# Patient Record
Sex: Female | Born: 1995
Health system: Southern US, Community
[De-identification: ages and names within clinical notes are randomized; demographics above are authoritative.]

## PROBLEM LIST (undated history)

## (undated) ENCOUNTER — Inpatient Hospital Stay: Payer: Self-pay

## (undated) DIAGNOSIS — Z789 Other specified health status: Secondary | ICD-10-CM

## (undated) HISTORY — PX: WISDOM TOOTH EXTRACTION: SHX21

---
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
## 2015-08-31 ENCOUNTER — Ambulatory Visit (INDEPENDENT_AMBULATORY_CARE_PROVIDER_SITE_OTHER): Payer: 59 | Admitting: Primary Care

## 2015-08-31 ENCOUNTER — Encounter: Payer: Self-pay | Admitting: Primary Care

## 2015-08-31 VITALS — BP 116/82 | HR 77 | Temp 98.0°F | Ht 65.75 in | Wt 120.8 lb

## 2015-08-31 DIAGNOSIS — J309 Allergic rhinitis, unspecified: Secondary | ICD-10-CM

## 2015-08-31 NOTE — Progress Notes (Signed)
Pre visit review using our clinic review tool, if applicable. No additional management support is needed unless otherwise documented below in the visit note. 

## 2015-08-31 NOTE — Progress Notes (Signed)
   Subjective:    Patient ID: Nicole Mcdaniel, female    DOB: 07/25/1996, 19 y.o.   MRN: [id number]  HPI  [name] is a 19 year old female who presents today with a chief complaint of fever. She also reports symptoms of cough, sneezing, sore throat, sinus pressure, nasal congestion. Her fever elevated to 100.0 last night. Her symptoms first began on Sunday with sinus pressure. She's taken benadryl, " 1allergy pill", and ibuprofen. Nothing helps to improve her symptoms. She's not had any sick contacts, although works in [employer].   Review of Systems  HENT: Positive for congestion, postnasal drip, sinus pressure, sneezing and sore throat. Negative for ear pain.   Respiratory: Positive for cough. Negative for shortness of breath.   Cardiovascular: Negative for chest pain.  Musculoskeletal: Negative for myalgias.       No past medical history on file.  Social History   Social History  . Marital Status: Single    Spouse Name: N/A  . Number of Children: N/A  . Years of Education: N/A   Occupational History  . Not on file.   Social History Main Topics  . Smoking status: Current Every Day Smoker -- 0.25 packs/day    Types: Cigarettes  . Smokeless tobacco: Never Used  . Alcohol Use: No  . Drug Use: No  . Sexual Activity: Not on file   Other Topics Concern  . Not on file   Social History Narrative    No past surgical history on file.  No family history on file.  No Known Allergies  No current outpatient prescriptions on file prior to visit.   No current facility-administered medications on file prior to visit.    BP 116/82 mmHg  Pulse 77  Temp(Src) 98 F (36.7 C) (Oral)  Ht 5' 5.75" (1.67 m)  Wt 120 lb 12.8 oz (54.795 kg)  BMI 19.65 kg/m2  SpO2 99%  LMP 08/05/2015    Objective:   Physical Exam  Constitutional: She appears well-nourished.  HENT:  Right Ear: Tympanic membrane and ear canal normal.  Left Ear: Tympanic membrane and ear canal normal.  Nose:  Mucosal edema present. Right sinus exhibits maxillary sinus tenderness. Right sinus exhibits no frontal sinus tenderness. Left sinus exhibits maxillary sinus tenderness. Left sinus exhibits no frontal sinus tenderness.  Mouth/Throat: Oropharynx is clear and moist.  Eyes: Conjunctivae are normal. Pupils are equal, round, and reactive to light.  Neck: Neck supple.  Cardiovascular: Normal rate and regular rhythm.   Pulmonary/Chest: Effort normal and breath sounds normal.  Lymphadenopathy:    She has no cervical adenopathy.  Skin: Skin is warm and dry.          Assessment & Plan:  Allergic Rhinitis:  Sneezing, postnasal drip, cough, sinus pressure, low grade fever (one day), all starting 2 days ago. No relief with 1 dose of "allergy pill", ibuprofen. Exam supports diagnosis of allergic rhinitis. Treat with supportive measures. Flonase, Claritin, Robitussin, fluids, rest.  Work note provided.  She is to call Monday next week if no improvement.

## 2015-08-31 NOTE — Patient Instructions (Signed)
Your symptoms are related to allergies, follow these instructions below:  Nasal congestion: Fluticasone (Flonase) nasal spray. Instill 2 sprays in each nostril once daily.  Sneezing, throat drainage, sinus pressure: Allergy pill such as Claritin, Zyrtec, Allegra every day for 2-3 weeks.  Cough: Delsym or Robitussin.   All of these remedies can be purchased over the counter.  Increase consumption of water and rest today.  Give us a call if no improvement on Monday next week.  It was a pleasure meeting you!  Upper Respiratory Infection, Adult Most upper respiratory infections (URIs) are a viral infection of the air passages leading to the lungs. A URI affects the nose, throat, and upper air passages. The most common type of URI is nasopharyngitis and is typically referred to as "the common cold." URIs run their course and usually go away on their own. Most of the time, a URI does not require medical attention, but sometimes a bacterial infection in the upper airways can follow a viral infection. This is called a secondary infection. Sinus and middle ear infections are common types of secondary upper respiratory infections. Bacterial pneumonia can also complicate a URI. A URI can worsen asthma and chronic obstructive pulmonary disease (COPD). Sometimes, these complications can require emergency medical care and may be life threatening.  CAUSES Almost all URIs are caused by viruses. A virus is a type of germ and can spread from one person to another.  RISKS FACTORS You may be at risk for a URI if:   You smoke.   You have chronic heart or lung disease.  You have a weakened defense (immune) system.   You are very young or very old.   You have nasal allergies or asthma.  You work in crowded or poorly ventilated areas.  You work in health care facilities or schools. SIGNS AND SYMPTOMS  Symptoms typically develop 2-3 days after you come in contact with a cold virus. Most viral URIs  last 7-10 days. However, viral URIs from the influenza virus (flu virus) can last 14-18 days and are typically more severe. Symptoms may include:   Runny or stuffy (congested) nose.   Sneezing.   Cough.   Sore throat.   Headache.   Fatigue.   Fever.   Loss of appetite.   Pain in your forehead, behind your eyes, and over your cheekbones (sinus pain).  Muscle aches.  DIAGNOSIS  Your health care provider may diagnose a URI by:  Physical exam.  Tests to check that your symptoms are not due to another condition such as:  Strep throat.  Sinusitis.  Pneumonia.  Asthma. TREATMENT  A URI goes away on its own with time. It cannot be cured with medicines, but medicines may be prescribed or recommended to relieve symptoms. Medicines may help:  Reduce your fever.  Reduce your cough.  Relieve nasal congestion. HOME CARE INSTRUCTIONS   Take medicines only as directed by your health care provider.   Gargle warm saltwater or take cough drops to comfort your throat as directed by your health care provider.  Use a warm mist humidifier or inhale steam from a shower to increase air moisture. This may make it easier to breathe.  Drink enough fluid to keep your urine clear or pale yellow.   Eat soups and other clear broths and maintain good nutrition.   Rest as needed.   Return to work when your temperature has returned to normal or as your health care provider advises. You may need to  stay home longer to avoid infecting others. You can also use a face mask and careful hand washing to prevent spread of the virus.  Increase the usage of your inhaler if you have asthma.   Do not use any tobacco products, including cigarettes, chewing tobacco, or electronic cigarettes. If you need help quitting, ask your health care provider. PREVENTION  The best way to protect yourself from getting a cold is to practice good hygiene.   Avoid oral or hand contact with people with  cold symptoms.   Wash your hands often if contact occurs.  There is no clear evidence that vitamin C, vitamin E, echinacea, or exercise reduces the chance of developing a cold. However, it is always recommended to get plenty of rest, exercise, and practice good nutrition.  SEEK MEDICAL CARE IF:   You are getting worse rather than better.   Your symptoms are not controlled by medicine.   You have chills.  You have worsening shortness of breath.  You have brown or red mucus.  You have yellow or brown nasal discharge.  You have pain in your face, especially when you bend forward.  You have a fever.  You have swollen neck glands.  You have pain while swallowing.  You have white areas in the back of your throat. SEEK IMMEDIATE MEDICAL CARE IF:   You have severe or persistent:  Headache.  Ear pain.  Sinus pain.  Chest pain.  You have chronic lung disease and any of the following:  Wheezing.  Prolonged cough.  Coughing up blood.  A change in your usual mucus.  You have a stiff neck.  You have changes in your:  Vision.  Hearing.  Thinking.  Mood. MAKE SURE YOU:   Understand these instructions.  Will watch your condition.  Will get help right away if you are not doing well or get worse.   This information is not intended to replace advice given to you by your health care provider. Make sure you discuss any questions you have with your health care provider.   Document Released: 04/04/2001 Document Revised: 02/23/2015 Document Reviewed: 01/14/2014 [employer] Interactive Patient Education [employer]

## 2015-09-24 ENCOUNTER — Ambulatory Visit (INDEPENDENT_AMBULATORY_CARE_PROVIDER_SITE_OTHER): Payer: 59

## 2015-09-24 DIAGNOSIS — Z23 Encounter for immunization: Secondary | ICD-10-CM | POA: Diagnosis not present

## 2016-02-22 ENCOUNTER — Ambulatory Visit (INDEPENDENT_AMBULATORY_CARE_PROVIDER_SITE_OTHER): Payer: 59 | Admitting: Internal Medicine

## 2016-02-22 ENCOUNTER — Encounter: Payer: Self-pay | Admitting: Internal Medicine

## 2016-02-22 VITALS — BP 116/70 | HR 65 | Temp 98.0°F | Wt 121.0 lb

## 2016-02-22 DIAGNOSIS — F411 Generalized anxiety disorder: Secondary | ICD-10-CM

## 2016-02-22 MED ORDER — ESCITALOPRAM OXALATE 10 MG PO TABS: ORAL_TABLET | ORAL | Status: DC

## 2016-02-22 NOTE — Progress Notes (Signed)
Subjective:    Patient ID: Nicole Mcdaniel, female    DOB: Oct 13, 1996, 20 y.o.   MRN: [id number]  HPI  Pt presents to the clinic today to discuss anxiety. This started 1 month ago. It is intermittent but occurs more days out of the week then not. When it occurs, she feels like her heart is racing, she gets clammy, she notices tingling in her hands and feet, has racing thoughts and excessive worry. She thinks this is triggered by recent family stress- her parents have recently separated. She has had similar issues with anxiety in the past, after her best fried committed suicide. She went to the therapy after that but did not really think it helped. She denies depression, SI/HI. She has no family history of mental disorders.   Review of Systems      History reviewed. No pertinent past medical history.  No current outpatient prescriptions on file.   No current facility-administered medications for this visit.    No Known Allergies  History reviewed. No pertinent family history.  Social History   Social History  . Marital Status: Single    Spouse Name: N/A  . Number of Children: N/A  . Years of Education: N/A   Occupational History  . Not on file.   Social History Main Topics  . Smoking status: Current Every Day Smoker -- 0.75 packs/day    Types: Cigarettes  . Smokeless tobacco: Never Used  . Alcohol Use: No  . Drug Use: No  . Sexual Activity: Not on file   Other Topics Concern  . Not on file   Social History Narrative     Constitutional: Denies fever, malaise, fatigue, headache or abrupt weight changes.  Respiratory: Denies difficulty breathing, shortness of breath, cough or sputum production.   Cardiovascular: Denies chest pain, chest tightness, palpitations or swelling in the hands or feet.   Neurological: Denies dizziness, difficulty with memory, difficulty with speech or problems with balance and coordination.  Psych: Pt reports anxiety. Denies depression,  SI/HI.  No other specific complaints in a complete review of systems (except as listed in HPI above).  Objective:   Physical Exam   BP 116/70 mmHg  Pulse 65  Temp(Src) 98 F (36.7 C) (Oral)  Wt 121 lb (54.885 kg)  SpO2 99%  LMP 02/14/2016 Wt Readings from Last 3 Encounters:  02/22/16 121 lb (54.885 kg)  08/31/15 120 lb 12.8 oz (54.795 kg)  07/02/13 122 lb (55.339 kg) (45 %*, Z = -0.12)   * Growth percentiles are based on CDC 2-20 Years data.    General: Appears her stated age, well developed, well nourished in NAD. Cardiovascular: Normal rate and rhythm. S1,S2 noted.  No murmur, rubs or gallops noted.  Pulmonary/Chest: Normal effort and positive vesicular breath sounds. No respiratory distress. No wheezes, rales or ronchi noted.  Neurological: Alert and oriented.  Psychiatric: She does appear mildly anxious. Behavior is normal. Judgment and thought content normal.   CBC    Component Value Date/Time   WBC 4.5 06/30/2009 0837   RBC 4.87 06/30/2009 0837   HGB 14.2 06/30/2009 0837   HCT 42.0 06/30/2009 0837   PLT 173.0 06/30/2009 0837   MCV 86.4 06/30/2009 0837   MCHC 33.8 06/30/2009 0837   RDW 12.2 06/30/2009 0837   LYMPHSABS 0.7 06/30/2009 0837   MONOABS 0.5 06/30/2009 0837   EOSABS 0.1 06/30/2009 0837   BASOSABS 0.0 06/30/2009 0837        Assessment & Plan:  Anxiety state:  Support offered today Discussed her treatment options, medication versus therapy eRx for Lexapro 10 mg daily QHS Discussed avoiding benzodiazepines due to addictive properties She declines referral for therapy at this time  Follow up with PCP in 6 weeks

## 2016-02-22 NOTE — Patient Instructions (Signed)
Generalized Anxiety Disorder Generalized anxiety disorder (GAD) is a mental disorder. It interferes with life functions, including relationships, work, and school. GAD is different from normal anxiety, which everyone experiences at some point in their lives in response to specific life events and activities. Normal anxiety actually helps us prepare for and get through these life events and activities. Normal anxiety goes away after the event or activity is over.  GAD causes anxiety that is not necessarily related to specific events or activities. It also causes excess anxiety in proportion to specific events or activities. The anxiety associated with GAD is also difficult to control. GAD can vary from mild to severe. People with severe GAD can have intense waves of anxiety with physical symptoms (panic attacks).  SYMPTOMS The anxiety and worry associated with GAD are difficult to control. This anxiety and worry are related to many life events and activities and also occur more days than not for 6 months or longer. People with GAD also have three or more of the following symptoms (one or more in children):  Restlessness.   Fatigue.  Difficulty concentrating.   Irritability.  Muscle tension.  Difficulty sleeping or unsatisfying sleep. DIAGNOSIS GAD is diagnosed through an assessment by your health care provider. Your health care provider will ask you questions aboutyour mood,physical symptoms, and events in your life. Your health care provider may ask you about your medical history and use of alcohol or drugs, including prescription medicines. Your health care provider may also do a physical exam and blood tests. Certain medical conditions and the use of certain substances can cause symptoms similar to those associated with GAD. Your health care provider may refer you to a mental health specialist for further evaluation. TREATMENT The following therapies are usually used to treat GAD:    Medication. Antidepressant medication usually is prescribed for long-term daily control. Antianxiety medicines may be added in severe cases, especially when panic attacks occur.   Talk therapy (psychotherapy). Certain types of talk therapy can be helpful in treating GAD by providing support, education, and guidance. A form of talk therapy called cognitive behavioral therapy can teach you healthy ways to think about and react to daily life events and activities.  Stress managementtechniques. These include yoga, meditation, and exercise and can be very helpful when they are practiced regularly. A mental health specialist can help determine which treatment is best for you. Some people see improvement with one therapy. However, other people require a combination of therapies.   This information is not intended to replace advice given to you by your health care provider. Make sure you discuss any questions you have with your health care provider.   Document Released: 02/03/2013 Document Revised: 10/30/2014 Document Reviewed: 02/03/2013 Elsevier Interactive Patient Education 2016 [employer] Inc.  

## 2016-02-22 NOTE — Progress Notes (Signed)
Pre visit review using our clinic review tool, if applicable. No additional management support is needed unless otherwise documented below in the visit note. 

## 2016-04-20 ENCOUNTER — Ambulatory Visit: Payer: 59 | Admitting: Internal Medicine

## 2016-04-30 ENCOUNTER — Telehealth: Payer: Self-pay | Admitting: Family Medicine

## 2016-04-30 DIAGNOSIS — Z0000 Encounter for general adult medical examination without abnormal findings: Secondary | ICD-10-CM | POA: Insufficient documentation

## 2016-04-30 NOTE — Telephone Encounter (Signed)
-----   Message from Baldomero LamyNatasha C Chavers sent at 04/26/2016  6:03 PM EDT ----- Regarding: Cpx labs Wed 7/12, need orders. Thanks! :-) Please order  future cpx labs for pt's upcoming lab appt. Thanks Rodney Boozeasha

## 2016-05-03 ENCOUNTER — Other Ambulatory Visit: Payer: 59

## 2016-05-03 ENCOUNTER — Other Ambulatory Visit (INDEPENDENT_AMBULATORY_CARE_PROVIDER_SITE_OTHER): Payer: 59

## 2016-05-03 DIAGNOSIS — Z0000 Encounter for general adult medical examination without abnormal findings: Secondary | ICD-10-CM | POA: Diagnosis not present

## 2016-05-03 LAB — COMPREHENSIVE METABOLIC PANEL
ALBUMIN: 4.5 g/dL (ref 3.5–5.2)
ALT: 24 U/L (ref 0–35)
AST: 18 U/L (ref 0–37)
Alkaline Phosphatase: 61 U/L (ref 39–117)
BUN: 18 mg/dL (ref 6–23)
CHLORIDE: 106 meq/L (ref 96–112)
CO2: 26 mEq/L (ref 19–32)
Calcium: 9.7 mg/dL (ref 8.4–10.5)
Creatinine, Ser: 0.59 mg/dL (ref 0.40–1.20)
GFR: 136.73 mL/min (ref >60.00)
Glucose, Bld: 88 mg/dL (ref 70–99)
POTASSIUM: 3.6 meq/L (ref 3.5–5.1)
SODIUM: 140 meq/L (ref 135–145)
Total Bilirubin: 0.8 mg/dL (ref 0.2–1.2)
Total Protein: 7.6 g/dL (ref 6.0–8.3)

## 2016-05-03 LAB — LIPID PANEL
CHOLESTEROL: 191 mg/dL (ref 0–200)
HDL: 68.3 mg/dL (ref >39.00)
LDL CALC: 111 mg/dL — AB (ref 0–99)
NonHDL: 122.24
Total CHOL/HDL Ratio: 3
Triglycerides: 57 mg/dL (ref 0.0–149.0)
VLDL: 11.4 mg/dL (ref 0.0–40.0)

## 2016-05-03 LAB — CBC WITH DIFFERENTIAL/PLATELET
BASOS PCT: 0.6 % (ref 0.0–3.0)
Basophils Absolute: 0 10*3/uL (ref 0.0–0.1)
EOS PCT: 2.2 % (ref 0.0–5.0)
Eosinophils Absolute: 0.1 10*3/uL (ref 0.0–0.7)
HCT: 41.8 % (ref 36.0–46.0)
HEMOGLOBIN: 13.9 g/dL (ref 12.0–15.0)
LYMPHS ABS: 1.8 10*3/uL (ref 0.7–4.0)
Lymphocytes Relative: 31.7 % (ref 12.0–46.0)
MCHC: 33.2 g/dL (ref 30.0–36.0)
MCV: 86.5 fl (ref 78.0–100.0)
MONO ABS: 0.4 10*3/uL (ref 0.1–1.0)
Monocytes Relative: 7.4 % (ref 3.0–12.0)
NEUTROS ABS: 3.4 10*3/uL (ref 1.4–7.7)
NEUTROS PCT: 58.1 % (ref 43.0–77.0)
Platelets: 215 10*3/uL (ref 150.0–400.0)
RBC: 4.83 Mil/uL (ref 3.87–5.11)
RDW: 14.5 % (ref 11.5–15.5)
WBC: 5.8 10*3/uL (ref 4.0–10.5)

## 2016-05-03 LAB — TSH: TSH: 0.73 u[IU]/mL (ref 0.35–4.50)

## 2016-05-10 ENCOUNTER — Telehealth: Payer: Self-pay

## 2016-05-10 ENCOUNTER — Encounter: Payer: 59 | Admitting: Family Medicine

## 2016-05-10 NOTE — Telephone Encounter (Signed)
I will see her on 7/21 as planned

## 2016-05-10 NOTE — Telephone Encounter (Signed)
[facility]; this [facility] note did not come to my desk top; just found on TH portal; pt has CPX on 05/12/16 with Dr Milinda Antisower.

## 2016-05-10 NOTE — Telephone Encounter (Signed)
PLEASE NOTE: All timestamps contained within this report are represented as [place] Standard Time. CONFIDENTIALTY NOTICE: This fax transmission is intended only for the addressee. It contains information that is legally privileged, confidential or otherwise protected from use or disclosure. If you are not the intended recipient, you are strictly prohibited from reviewing, disclosing, copying using or disseminating any of this information or taking any action in reliance on or regarding this information. If you have received this fax in error, please notify us immediately by telephone so that we can arrange for its return to us. Phone: [phone], Toll-Free: [phone], Fax: [phone] Page: 1 of 1 Call Id: [id number] [facility] Client Nonclinical Telephone Record [facility] Day Physician Milinda Antisower, Idamae SchullerMarne - MD Contact Type Call Who Is Calling Patient / Member / Family / Caregiver Caller Name Tana ConchHannah Skarzynski Caller Phone Number [phone] Patient Name Tana ConchHannah Kashuba Call Type Message Only Information Provided Reason for Call Request for General Office Information Initial Comment Caller states she has been dx with a UTI twice in the past month, it keeps coming back after the rounds of abx. She wants to know if she should come in for more testing. Additional Comment Call Closed By: Neta EhlersEmily Cogburn Transaction Date/Time: 05/10/2016 3:21:42 PM (ET)

## 2016-05-11 NOTE — Telephone Encounter (Signed)
Called pt to let her know we will address it at her appt tomorrow but pt didn't answer and voicemail box is full

## 2016-05-12 ENCOUNTER — Other Ambulatory Visit (HOSPITAL_COMMUNITY)
Admission: RE | Admit: 2016-05-12 | Discharge: 2016-05-12 | Disposition: A | Discharge status: Home / Self Care | Payer: 59 | Source: Ambulatory Visit | Attending: Family Medicine | Admitting: Family Medicine

## 2016-05-12 ENCOUNTER — Encounter: Payer: Self-pay | Admitting: Family Medicine

## 2016-05-12 ENCOUNTER — Ambulatory Visit (INDEPENDENT_AMBULATORY_CARE_PROVIDER_SITE_OTHER): Payer: 59 | Admitting: Family Medicine

## 2016-05-12 VITALS — BP 130/78 | HR 72 | Temp 98.2°F | Ht 65.75 in | Wt 122.0 lb

## 2016-05-12 DIAGNOSIS — Z113 Encounter for screening for infections with a predominantly sexual mode of transmission: Secondary | ICD-10-CM | POA: Diagnosis present

## 2016-05-12 DIAGNOSIS — Z01419 Encounter for gynecological examination (general) (routine) without abnormal findings: Secondary | ICD-10-CM | POA: Insufficient documentation

## 2016-05-12 DIAGNOSIS — Z0000 Encounter for general adult medical examination without abnormal findings: Secondary | ICD-10-CM | POA: Diagnosis not present

## 2016-05-12 DIAGNOSIS — Z720 Tobacco use: Secondary | ICD-10-CM

## 2016-05-12 DIAGNOSIS — N920 Excessive and frequent menstruation with regular cycle: Secondary | ICD-10-CM | POA: Diagnosis not present

## 2016-05-12 DIAGNOSIS — R300 Dysuria: Secondary | ICD-10-CM

## 2016-05-12 DIAGNOSIS — F17200 Nicotine dependence, unspecified, uncomplicated: Secondary | ICD-10-CM

## 2016-05-12 LAB — HM PAP SMEAR: HM PAP: NORMAL

## 2016-05-12 MED ORDER — LEVONORGESTREL-ETHINYL ESTRAD 0.1-20 MG-MCG PO TABS: 1.0000 | ORAL_TABLET | Freq: Every day | ORAL | Status: DC

## 2016-05-12 MED ORDER — PHENAZOPYRIDINE HCL 200 MG PO TABS: 200.0000 mg | ORAL_TABLET | Freq: Three times a day (TID) | ORAL | Status: AC | PRN

## 2016-05-12 MED ORDER — ESCITALOPRAM OXALATE 10 MG PO TABS: ORAL_TABLET | ORAL | Status: DC

## 2016-05-12 NOTE — Patient Instructions (Addendum)
Increase water intake and eliminate other beverages for at least a week  If urinary symptoms are not gone in 1-2 weeks -let me know so we can refer you to urology  Please work on quitting smoking - this can cause blood clots with birth control  Start the pill the first Sunday after your period starts (if it starts on a Sunday-start it that day)  Continue condom use for STD prevention  Quit smoking  Use your phone alarm or app so you do not miss doses  Today we did pap and std screen  Take care of yourself

## 2016-05-12 NOTE — Progress Notes (Signed)
Pre visit review using our clinic review tool, if applicable. No additional management support is needed unless otherwise documented below in the visit note. 

## 2016-05-12 NOTE — Progress Notes (Signed)
Subjective:    Patient ID: Nicole Mcdaniel, female    DOB: 06/18/1996, 20 y.o.   MRN: [id number]  HPI Here for health maintenance exam and to review chronic medical problems    Has had many utis lately  [facility] and currently on abx - did culture (still pending)  Had wbc on ua  Did pelvic exam - wet prep looked pretty normal   Looked on care everywhere- urine culture from 7/19 is negative/ no growth    Had utis in June (given cipro) - fine until the 15th  and then 7/15 also macrobid - culture was done - negative for everything  On pyridium for pain   Has never been through this before  On 7/19 - burning to urinate and pressure on bladder  Improved for the most part - but still some pressure  No dysuria but still taking pyridium   She drinks tea and soda and lemonade and water  Has inc her water    Wt Readings from Last 3 Encounters:  05/12/16 122 lb (55.339 kg)  02/22/16 121 lb (54.885 kg)  08/31/15 120 lb 12.8 oz (54.795 kg)  bmi of 19.8  BP Readings from Last 3 Encounters:  05/12/16 130/78  02/22/16 116/70  08/31/15 116/82     Smoking status - she would like to quit  Thinks she could put them down cold [place]  Has quit for 3 mo before   HIV screening -has not had  Is interested in STD screening (gc and chlamydia) No new exposures  One partner now  No vaginal symptoms  Has not had a pap test yet -needs to get her first one  Uses condoms most of the time Does not want to get pregnant    Tetanus shot 8/13  Pap /gyn - periods (are very heavy)  Got one yeast infection- took something otc  Had considered going on the pill - is really bad about taking med  She in the past has had trouble taking pill every day -now has an app to remind her  Has been on lessina in the past   Has never tried the depo shot    Flu shot 12/16  On lexapro for mood  Still working for her   Results for orders placed or performed in visit on 05/03/16  CBC  with Differential/Platelet  Result Value Ref Range   WBC 5.8 4.0 - 10.5 K/uL   RBC 4.83 3.87 - 5.11 Mil/uL   Hemoglobin 13.9 12.0 - 15.0 g/dL   HCT 36.641.8 44.036.0 - 34.746.0 %   MCV 86.5 78.0 - 100.0 fl   MCHC 33.2 30.0 - 36.0 g/dL   RDW 42.514.5 95.611.5 - 38.715.5 %   Platelets 215.0 150.0 - 400.0 K/uL   Neutrophils Relative % 58.1 43.0 - 77.0 %   Lymphocytes Relative 31.7 12.0 - 46.0 %   Monocytes Relative 7.4 3.0 - 12.0 %   Eosinophils Relative 2.2 0.0 - 5.0 %   Basophils Relative 0.6 0.0 - 3.0 %   Neutro Abs 3.4 1.4 - 7.7 K/uL   Lymphs Abs 1.8 0.7 - 4.0 K/uL   Monocytes Absolute 0.4 0.1 - 1.0 K/uL   Eosinophils Absolute 0.1 0.0 - 0.7 K/uL   Basophils Absolute 0.0 0.0 - 0.1 K/uL  Comprehensive metabolic panel  Result Value Ref Range   Sodium 140 135 - 145 mEq/L   Potassium 3.6 3.5 - 5.1 mEq/L   Chloride 106 96 - 112  mEq/L   CO2 26 19 - 32 mEq/L   Glucose, Bld 88 70 - 99 mg/dL   BUN 18 6 - 23 mg/dL   Creatinine, Ser 4.09 0.40 - 1.20 mg/dL   Total Bilirubin 0.8 0.2 - 1.2 mg/dL   Alkaline Phosphatase 61 39 - 117 U/L   AST 18 0 - 37 U/L   ALT 24 0 - 35 U/L   Total Protein 7.6 6.0 - 8.3 g/dL   Albumin 4.5 3.5 - 5.2 g/dL   Calcium 9.7 8.4 - 81.1 mg/dL   GFR 914.78 >29.56 mL/min  Lipid panel  Result Value Ref Range   Cholesterol 191 0 - 200 mg/dL   Triglycerides 21.3 0.0 - 149.0 mg/dL   HDL 08.65 >78.46 mg/dL   VLDL 96.2 0.0 - 95.2 mg/dL   LDL Cholesterol 841 (H) 0 - 99 mg/dL   Total CHOL/HDL Ratio 3    NonHDL 122.24   TSH  Result Value Ref Range   TSH 0.73 0.35 - 4.50 uIU/mL    All in nl limits  Cholesterol is pretty good with good HDL  Does not eat quite as healthy as she should    Patient Active Problem List   Diagnosis Date Noted  . Dysuria 05/12/2016  . Encounter for routine gynecological examination 05/12/2016  . Screen for STD (sexually transmitted disease) 05/12/2016  . Routine general medical examination at a health care facility 04/30/2016  . Headache(784.0) 07/31/2012    . Breast pain in female 07/05/2012  . Well adolescent visit 06/03/2012  . Heavy menses 06/03/2012  . ALLERGIC RHINITIS 01/21/2008   No past medical history on file. No past surgical history on file. Social History  Substance Use Topics  . Smoking status: Current Every Day Smoker -- 0.75 packs/day    Types: Cigarettes  . Smokeless tobacco: Never Used  . Alcohol Use: 0.0 oz/week    0 Standard drinks or equivalent per week     Comment: occ   No family history on file. No Known Allergies No current outpatient prescriptions on file prior to visit.   No current facility-administered medications on file prior to visit.    Review of Systems Review of Systems  Constitutional: Negative for fever, appetite change, fatigue and unexpected weight change.  Eyes: Negative for pain and visual disturbance.  Respiratory: Negative for cough and shortness of breath.   Cardiovascular: Negative for cp or palpitations    Gastrointestinal: Negative for nausea, diarrhea and constipation.  Genitourinary: Negative for urgency and frequency. pos for mild dysuria and bladder pressure pos for dymenorrhea  Skin: Negative for pallor or rash   Neurological: Negative for weakness, light-headedness, numbness and headaches.  Hematological: Negative for adenopathy. Does not bruise/bleed easily.  Psychiatric/Behavioral: Negative for dysphoric mood. The patient is not nervous/anxious.         Objective:   Physical Exam  Constitutional: She appears well-developed and well-nourished. No distress.  Slim and well appearing   HENT:  Head: Normocephalic and atraumatic.  Right Ear: External ear normal.  Left Ear: External ear normal.  Mouth/Throat: Oropharynx is clear and moist.  Eyes: Conjunctivae and EOM are normal. Pupils are equal, round, and reactive to light. No scleral icterus.  Neck: Normal range of motion. Neck supple. No JVD present. Carotid bruit is not present. No thyromegaly present.  Cardiovascular:  Normal rate, regular rhythm, normal heart sounds and intact distal pulses.  Exam reveals no gallop.   Pulmonary/Chest: Effort normal and breath sounds normal. No respiratory distress.  She has no wheezes. She exhibits no tenderness.  Abdominal: Soft. Bowel sounds are normal. She exhibits no distension, no abdominal bruit and no mass. There is no tenderness.  Genitourinary: No breast swelling, tenderness, discharge or bleeding.  Breast exam: No mass, nodules, thickening, tenderness, bulging, retraction, inflamation, nipple discharge or skin changes noted.  No axillary or clavicular LA.              Anus appears normal w/o hemorrhoids or masses     External genitalia : nl appearance and hair distribution/no lesions     Urethral meatus : nl size, no lesions or prolapse     Urethra: no masses, tenderness or scarring    Bladder : no masses or tenderness     Vagina: nl general appearance, no discharge or  Lesions, no significant cystocele  or rectocele     Cervix: no lesions/ discharge or friability    Uterus: nl size, contour, position, and mobility (not fixed) , non tender    Adnexa : no masses, tenderness, enlargement or nodularity         Musculoskeletal: Normal range of motion. She exhibits no edema or tenderness.  Lymphadenopathy:    She has no cervical adenopathy.  Neurological: She is alert. She has normal reflexes. No cranial nerve deficit. She exhibits normal muscle tone. Coordination normal.  Skin: Skin is warm and dry. No rash noted. No erythema. No pallor.  Psychiatric: She has a normal mood and affect.          Assessment & Plan:   Problem List Items Addressed This Visit      Other   Screen for STD (sexually transmitted disease)    gc and chlamydia screen added to pap Pt declines HIV and syphillis screening for now      Relevant Orders   Cytology - PAP   Routine general medical examination at a health care facility - Primary    Reviewed  health habits including diet and exercise and skin cancer prevention Reviewed appropriate screening tests for age  Also reviewed health mt list, fam hx and immunization status , as well as social and family history   See HPI Gyn exam done  Labs reviewed today  Disc smoking cessation and she plans to quit- exp with starting OC      Heavy menses    Starting OC -lessina  Update if no imp in 3 mo       Encounter for routine gynecological examination    Pap and gyn exam done  Also screen for gc and chlamydia  Px OC- lessina/ for birth control and also help with heavy menses Long discussion re: way to take OC properly and avoidance of smoking  Risks of blood clots outlined as well as possible side eff Pt aware that this does not prevent stds and condoms should still be used inst that it may take up to 3 months for menses to fall into rhythm or side eff to stop  Adv to call if problems or questions         Relevant Orders   Cytology - PAP   Dysuria    Reviewed several UC visits for dysuria  tx for uti with cipro once Other ucx have been neg  Also neg wet prep ? Urethritis or bladder irritation  Disc stopping all beverages but water for 1-2 weeks  Update if no imp -would consider urol consult  Did refill pyridium for prn use

## 2016-05-13 DIAGNOSIS — F17200 Nicotine dependence, unspecified, uncomplicated: Secondary | ICD-10-CM | POA: Insufficient documentation

## 2016-05-13 NOTE — Assessment & Plan Note (Signed)
Starting OC -lessina  Update if no imp in 3 mo

## 2016-05-13 NOTE — Assessment & Plan Note (Signed)
gc and chlamydia screen added to pap Pt declines HIV and syphillis screening for now

## 2016-05-13 NOTE — Assessment & Plan Note (Signed)
Disc in detail risks of smoking and possible outcomes including copd, vascular/ heart disease, cancer , respiratory and sinus infections  Pt voices understanding  Aware OC will inc risk of blood clot if she smokes Plans to quit

## 2016-05-13 NOTE — Assessment & Plan Note (Signed)
Pap and gyn exam done  Also screen for gc and chlamydia  Px OC- lessina/ for birth control and also help with heavy menses Long discussion re: way to take OC properly and avoidance of smoking  Risks of blood clots outlined as well as possible side eff Pt aware that this does not prevent stds and condoms should still be used inst that it may take up to 3 months for menses to fall into rhythm or side eff to stop  Adv to call if problems or questions

## 2016-05-13 NOTE — Assessment & Plan Note (Signed)
Reviewed several UC visits for dysuria  tx for uti with cipro once Other ucx have been neg  Also neg wet prep ? Urethritis or bladder irritation  Disc stopping all beverages but water for 1-2 weeks  Update if no imp -would consider urol consult  Did refill pyridium for prn use

## 2016-05-13 NOTE — Assessment & Plan Note (Signed)
Reviewed health habits including diet and exercise and skin cancer prevention Reviewed appropriate screening tests for age  Also reviewed health mt list, fam hx and immunization status , as well as social and family history   See HPI Gyn exam done  Labs reviewed today  Disc smoking cessation and she plans to quit- exp with starting OC

## 2016-05-15 LAB — CYTOLOGY - PAP

## 2016-05-16 ENCOUNTER — Telehealth: Payer: Self-pay | Admitting: Family Medicine

## 2016-05-16 MED ORDER — FLUCONAZOLE 150 MG PO TABS: 150.0000 mg | ORAL_TABLET | Freq: Once | ORAL | 0 refills | Status: DC

## 2016-05-16 NOTE — Telephone Encounter (Signed)
Please let pt know that pap is normal but she has a yeast infection Please call in diflucan  Also GC and chlamydia tests are negative

## 2016-05-17 NOTE — Telephone Encounter (Signed)
Left voicemail requesting pt to call office back 

## 2016-05-18 MED ORDER — FLUCONAZOLE 150 MG PO TABS: 150.0000 mg | ORAL_TABLET | Freq: Once | ORAL | 0 refills | Status: AC

## 2016-05-18 NOTE — Telephone Encounter (Signed)
Pt notified of pap smear results and Dr. Royden Purl comments. Rx for yeast inf sent to pharmacy

## 2016-08-01 ENCOUNTER — Ambulatory Visit (INDEPENDENT_AMBULATORY_CARE_PROVIDER_SITE_OTHER): Payer: 59 | Admitting: Primary Care

## 2016-08-01 ENCOUNTER — Encounter: Payer: Self-pay | Admitting: Primary Care

## 2016-08-01 VITALS — BP 110/70 | HR 67 | Temp 98.1°F | Ht 65.75 in | Wt 132.4 lb

## 2016-08-01 DIAGNOSIS — R52 Pain, unspecified: Secondary | ICD-10-CM

## 2016-08-01 NOTE — Progress Notes (Signed)
Pre visit review using our clinic review tool, if applicable. No additional management support is needed unless otherwise documented below in the visit note. 

## 2016-08-01 NOTE — Progress Notes (Signed)
   Subjective:    Patient ID: Nicole Mcdaniel, female    DOB: 05/18/1996, 20 y.o.   MRN: [id number]  HPI  Mr. Nicole Mcdaniel is a 20 year old female who presents today with a chief complaint of body aches. She also reports nasal congestion, mild sore throat, fatigue, mild cough. Her symptoms have been present since Wednesday last week (6 days). She denies fevers, sick contacts, shortness of breath, decrease in appetite, night sweats. Her cough is non productive. She's not taken anything OTC for her symptoms. Since Wednesday her aches have become worse.   Review of Systems  Constitutional: Positive for fatigue. Negative for appetite change, diaphoresis and fever.  HENT: Positive for congestion and sore throat.   Respiratory: Positive for cough. Negative for shortness of breath and wheezing.   Cardiovascular: Negative for chest pain.       No past medical history on file.   Social History   Social History  . Marital status: Single    Spouse name: N/A  . Number of children: N/A  . Years of education: N/A   Occupational History  . Not on file.   Social History Main Topics  . Smoking status: Current Every Day Smoker    Packs/day: 0.75    Types: Cigarettes  . Smokeless tobacco: Never Used  . Alcohol use 0.0 oz/week     Comment: occ  . Drug use: No  . Sexual activity: Not on file   Other Topics Concern  . Not on file   Social History Narrative  . No narrative on file    No past surgical history on file.  No family history on file.  No Known Allergies  Current Outpatient Prescriptions on File Prior to Visit  Medication Sig Dispense Refill  . levonorgestrel-ethinyl estradiol (LESSINA-28) 0.1-20 MG-MCG tablet Take 1 tablet by mouth daily. 1 Package 11   No current facility-administered medications on file prior to visit.     BP 110/70   Pulse 67   Temp 98.1 F (36.7 C) (Oral)   Ht 5' 5.75" (1.67 m)   Wt 132 lb 6.4 oz (60.1 kg)   LMP 07/15/2016   SpO2 98%   BMI 21.53  kg/m    Objective:   Physical Exam  Constitutional: She appears well-nourished.  HENT:  Right Ear: Tympanic membrane and ear canal normal.  Left Ear: Tympanic membrane and ear canal normal.  Nose: Right sinus exhibits no maxillary sinus tenderness and no frontal sinus tenderness. Left sinus exhibits no maxillary sinus tenderness and no frontal sinus tenderness.  Mouth/Throat: Posterior oropharyngeal erythema present.  Eyes: Conjunctivae are normal.  Neck: Neck supple.  Cardiovascular: Normal rate and regular rhythm.   Pulmonary/Chest: Effort normal and breath sounds normal. She has no wheezes. She has no rales.  Lymphadenopathy:    She has no cervical adenopathy.  Skin: Skin is warm and dry.          Assessment & Plan:  URI:  Sore throat, nasal congestion, mild cough, body aches 6 days. Has not taken anything OTC for her symptoms. Exam today with clear lungs, mild erythema to posterior pharynx otherwise unremarkable. Does not appear acutely ill. Vital signs stable. Suspect viral involvement. Do not suspect mono, influenza. No signs indicating bacterial involvement based off of presentation, examination, history of present illness. Discussed use of Tylenol for body aches and sore throat. Increase fluids and rest. Return precautions provided.  Nicole Mcdaniel,Nicole Erlandson Kendal, NP

## 2016-08-01 NOTE — Patient Instructions (Addendum)
Your symptoms are representative of a viral illness which will resolve on its own over time. Our goal is to treat your symptoms in order to aid your body in the healing process and to make you more comfortable.   Start Tylenol for body aches and sore throat. Take take 500 mg every 6 hours as needed for body aches and sore throat.  Ensure you are staying hydrated with water.  Please notify me if you develop persistent fevers of 101, start coughing up green mucous, notice increased fatigue or weakness, or feel worse after 1 week of onset of symptoms.   It was a pleasure meeting you!  Viral Infections A viral infection can be caused by different types of viruses.Most viral infections are not serious and resolve on their own. However, some infections may cause severe symptoms and may lead to further complications. SYMPTOMS Viruses can frequently cause:  Minor sore throat.  Aches and pains.  Headaches.  Runny nose.  Different types of rashes.  Watery eyes.  Tiredness.  Cough.  Loss of appetite.  Gastrointestinal infections, resulting in nausea, vomiting, and diarrhea. These symptoms do not respond to antibiotics because the infection is not caused by bacteria. However, you might catch a bacterial infection following the viral infection. This is sometimes called a "superinfection." Symptoms of such a bacterial infection may include:  Worsening sore throat with pus and difficulty swallowing.  Swollen neck glands.  Chills and a high or persistent fever.  Severe headache.  Tenderness over the sinuses.  Persistent overall ill feeling (malaise), muscle aches, and tiredness (fatigue).  Persistent cough.  Yellow, green, or brown mucus production with coughing. HOME CARE INSTRUCTIONS   Only take over-the-counter or prescription medicines for pain, discomfort, diarrhea, or fever as directed by your caregiver.  Drink enough water and fluids to keep your urine clear or pale  yellow. Sports drinks can provide valuable electrolytes, sugars, and hydration.  Get plenty of rest and maintain proper nutrition. Soups and broths with crackers or rice are fine. SEEK IMMEDIATE MEDICAL CARE IF:   You have severe headaches, shortness of breath, chest pain, neck pain, or an unusual rash.  You have uncontrolled vomiting, diarrhea, or you are unable to keep down fluids.  You or your child has an oral temperature above 102 F (38.9 C), not controlled by medicine.  Your baby is older than 3 months with a rectal temperature of 102 F (38.9 C) or higher.  Your baby is 543 months old or younger with a rectal temperature of 100.4 F (38 C) or higher. MAKE SURE YOU:   Understand these instructions.  Will watch your condition.  Will get help right away if you are not doing well or get worse.   This information is not intended to replace advice given to you by your health care provider. Make sure you discuss any questions you have with your health care provider.   Document Released: 07/19/2005 Document Revised: 01/01/2012 Document Reviewed: 03/17/2015 Elsevier Interactive Patient Education [employer]2016 Elsevier Inc.

## 2016-12-28 ENCOUNTER — Encounter: Payer: Self-pay | Admitting: Internal Medicine

## 2016-12-28 ENCOUNTER — Ambulatory Visit (INDEPENDENT_AMBULATORY_CARE_PROVIDER_SITE_OTHER): Payer: 59 | Admitting: Internal Medicine

## 2016-12-28 ENCOUNTER — Encounter (INDEPENDENT_AMBULATORY_CARE_PROVIDER_SITE_OTHER): Payer: Self-pay

## 2016-12-28 ENCOUNTER — Ambulatory Visit (INDEPENDENT_AMBULATORY_CARE_PROVIDER_SITE_OTHER)
Admission: RE | Admit: 2016-12-28 | Discharge: 2016-12-28 | Disposition: A | Discharge status: Home / Self Care | Payer: 59 | Source: Ambulatory Visit | Attending: Internal Medicine | Admitting: Internal Medicine

## 2016-12-28 VITALS — BP 108/78 | HR 81 | Temp 98.5°F | Wt 141.0 lb

## 2016-12-28 DIAGNOSIS — M79671 Pain in right foot: Secondary | ICD-10-CM | POA: Diagnosis not present

## 2016-12-28 DIAGNOSIS — R350 Frequency of micturition: Secondary | ICD-10-CM | POA: Diagnosis not present

## 2016-12-28 DIAGNOSIS — R300 Dysuria: Secondary | ICD-10-CM | POA: Diagnosis not present

## 2016-12-28 LAB — POC URINALSYSI DIPSTICK (AUTOMATED)
Bilirubin, UA: NEGATIVE
Glucose, UA: NEGATIVE
Ketones, UA: NEGATIVE
LEUKOCYTES UA: NEGATIVE
Nitrite, UA: NEGATIVE
PH UA: 6
PROTEIN UA: NEGATIVE
RBC UA: NEGATIVE
Urobilinogen, UA: NEGATIVE

## 2016-12-28 NOTE — Progress Notes (Signed)
Subjective:    Patient ID: Nicole Mcdaniel, female    DOB: 04/09/1996, 21 y.o.   MRN: [id number]  HPI  Pt presents to the clinic today with a few concerns:  1-She c/p right foot pain. This started 2 weeks ago. She rolled her ankle while running. She describes the pain as achy. It can be sharp with certain movements. There was some swelling in the beginning but none now. She reports the whole top of her foot was bruised. She did try some Ibuprofen initially but has not taken anything recently.   2- She also c/o urinary frequency and dysuria. This started 2 days ago. She denies low back pain, fever or nausea. She has not seen any blood in her stool. She denies vaginal discharge, odor or itching. She has not tried anything OTC for this.   Review of Systems      No past medical history on file.  Current Outpatient Prescriptions  Medication Sig Dispense Refill  . levonorgestrel-ethinyl estradiol (LESSINA-28) 0.1-20 MG-MCG tablet Take 1 tablet by mouth daily. 1 Package 11   No current facility-administered medications for this visit.     No Known Allergies  No family history on file.  Social History   Social History  . Marital status: Single    Spouse name: N/A  . Number of children: N/A  . Years of education: N/A   Occupational History  . Not on file.   Social History Main Topics  . Smoking status: Current Every Day Smoker    Packs/day: 0.75    Types: Cigarettes  . Smokeless tobacco: Never Used  . Alcohol use 0.0 oz/week     Comment: occ  . Drug use: No  . Sexual activity: Not on file   Other Topics Concern  . Not on file   Social History Narrative  . No narrative on file     Constitutional: Denies fever, malaise, fatigue, headache or abrupt weight changes.  GU: Pt reports frequency and dysuria. Denies urgency, burning sensation, blood in urine, odor or discharge. Musculoskeletal: Pt reports right foot pain. Denies decrease in range of motion, difficulty with  gait, muscle pain or joint swelling.    No other specific complaints in a complete review of systems (except as listed in HPI above).  Objective:   Physical Exam   BP 108/78   Pulse 81   Temp 98.5 F (36.9 C) (Oral)   Wt 141 lb (64 kg)   LMP 12/15/2016   SpO2 98%   BMI 22.93 kg/m  Wt Readings from Last 3 Encounters:  12/28/16 141 lb (64 kg)  08/01/16 132 lb 6.4 oz (60.1 kg)  05/12/16 122 lb (55.3 kg)    General: Appears her stated age, well developed, well nourished in NAD. Skin: Bruising noted over the 3rd and 4th metatarsals. Cardiovascular: Right pedal pulse 2+. Pulmonary/Chest: Normal effort and positive vesicular breath sounds. No respiratory distress. No wheezes, rales or ronchi noted.  Abdomen: Soft and nontender. No CVA tenderness. Musculoskeletal: Normal flexion, extension and rotation of the right ankle. No joint swelling noted. She has able to walk on tip toes and heels. No difficulty with gait.    BMET    Component Value Date/Time   NA 140 05/03/2016 1147   K 3.6 05/03/2016 1147   CL 106 05/03/2016 1147   CO2 26 05/03/2016 1147   GLUCOSE 88 05/03/2016 1147   BUN 18 05/03/2016 1147   CREATININE 0.59 05/03/2016 1147   CALCIUM 9.7 05/03/2016  1147    Lipid Panel     Component Value Date/Time   CHOL 191 05/03/2016 1147   TRIG 57.0 05/03/2016 1147   HDL 68.30 05/03/2016 1147   CHOLHDL 3 05/03/2016 1147   VLDL 11.4 05/03/2016 1147   LDLCALC 111 (H) 05/03/2016 1147    CBC    Component Value Date/Time   WBC 5.8 05/03/2016 1147   RBC 4.83 05/03/2016 1147   HGB 13.9 05/03/2016 1147   HCT 41.8 05/03/2016 1147   PLT 215.0 05/03/2016 1147   MCV 86.5 05/03/2016 1147   MCHC 33.2 05/03/2016 1147   RDW 14.5 05/03/2016 1147   LYMPHSABS 1.8 05/03/2016 1147   MONOABS 0.4 05/03/2016 1147   EOSABS 0.1 05/03/2016 1147   BASOSABS 0.0 05/03/2016 1147    Hgb A1C No results found for: HGBA1C         Assessment & Plan:   Urinary Frequency and  Dysuria:  Urinalysis normal Will send urine culture  Try to avoid soda's for a few days Push water  Right foot pain:  Likely a sprain She is requesting xray today- ordered Ankle exercise given Advised her to try RICE therapy  Will call you after xray, RTC as needed Nicole Reaper, NP

## 2016-12-28 NOTE — Addendum Note (Signed)
Addended by: Roena MaladyEVONTENNO, Gailene Youkhana Y on: 12/28/2016 03:09 PM   Modules accepted: Orders

## 2016-12-28 NOTE — Patient Instructions (Signed)
Ankle Sprain, Phase I Rehab  Ask your health care provider which exercises are safe for you. Do exercises exactly as told by your health care provider and adjust them as directed. It is normal to feel mild stretching, pulling, tightness, or discomfort as you do these exercises, but you should stop right away if you feel sudden pain or your pain gets worse. Do not begin these exercises until told by your health care provider.  Stretching and range of motion exercises  These exercises warm up your muscles and joints and improve the movement and flexibility of your lower leg and ankle. These exercises also help to relieve pain and stiffness.  Exercise A: Gastroc and soleus stretch     1. Sit on the floor with your left / right leg extended.  2. Loop a belt or towel around the ball of your left / right foot. The ball of your foot is on the walking surface, right under your toes.  3. Keep your left / right ankle and foot relaxed and keep your knee straight while you use the belt or towel to pull your foot toward you. You should feel a gentle stretch behind your calf or knee.  4. Hold this position for __________ seconds, then release to the starting position.  Repeat the exercise with your knee bent. You can put a pillow or a rolled bath towel under your knee to support it. You should feel a stretch deep in your calf or at your Achilles tendon.  Repeat each stretch __________ times. Complete these stretches __________ times a day.  Exercise B: Ankle alphabet     1. Sit with your left / right leg supported at the lower leg.  ? Do not rest your foot on anything.  ? Make sure your foot has room to move freely.  2. Think of your left / right foot as a paintbrush, and move your foot to trace each letter of the alphabet in the air. Keep your hip and knee still while you trace. Make the letters as large as you can without feeling discomfort.  3. Trace every letter from A to Z.  Repeat __________ times. Complete this exercise  __________ times a day.  Strengthening exercises  These exercises build strength and endurance in your ankle and lower leg. Endurance is the ability to use your muscles for a long time, even after they get tired.  Exercise C: Dorsiflexors     1. Secure a rubber exercise band or tube to an object, such as a table leg, that will stay still when the band is pulled. Secure the other end around your left / right foot.  2. Sit on the floor facing the object, with your left / right leg extended. The band or tube should be slightly tense when your foot is relaxed.  3. Slowly bring your foot toward you, pulling the band tighter.  4. Hold this position for __________ seconds.  5. Slowly return your foot to the starting position.  Repeat __________ times. Complete this exercise __________ times a day.  Exercise D: Plantar flexors     1. Sit on the floor with your left / right leg extended.  2. Loop a rubber exercise tube or band around the ball of your left / right foot. The ball of your foot is on the walking surface, right under your toes.  ? Hold the ends of the band or tube in your hands.  ? The band or tube should be slightly   tense when your foot is relaxed.  3. Slowly point your foot and toes downward, pushing them away from you.  4. Hold this position for __________ seconds.  5. Slowly return your foot to the starting position.  Repeat __________ times. Complete this exercise __________ times a day.  Exercise E: Evertors   1. Sit on the floor with your legs straight out in front of you.  2. Loop a rubber exercise band or tube around the ball of your left / right foot. The ball of your foot is on the walking surface, right under your toes.  ? Hold the ends of the band in your hands, or secure the band to a stable object.  ? The band or tube should be slightly tense when your foot is relaxed.  3. Slowly push your foot outward, away from your other leg.  4. Hold this position for __________ seconds.  5. Slowly return your  foot to the starting position.  Repeat __________ times. Complete this exercise __________ times a day.  This information is not intended to replace advice given to you by your health care provider. Make sure you discuss any questions you have with your health care provider.  Document Released: 05/10/2005 Document Revised: 06/15/2016 Document Reviewed: 08/23/2015  Elsevier Interactive Patient Education © 2017 [employer] Inc[employer]

## 2017-02-05 ENCOUNTER — Ambulatory Visit
Admission: EM | Admit: 2017-02-05 | Discharge: 2017-02-05 | Disposition: A | Discharge status: Home / Self Care | Payer: 59 | Attending: Family Medicine | Admitting: Family Medicine

## 2017-02-05 DIAGNOSIS — R300 Dysuria: Secondary | ICD-10-CM

## 2017-02-05 DIAGNOSIS — S0502XA Injury of conjunctiva and corneal abrasion without foreign body, left eye, initial encounter: Secondary | ICD-10-CM | POA: Diagnosis not present

## 2017-02-05 DIAGNOSIS — R350 Frequency of micturition: Secondary | ICD-10-CM | POA: Diagnosis not present

## 2017-02-05 DIAGNOSIS — Z23 Encounter for immunization: Secondary | ICD-10-CM | POA: Diagnosis not present

## 2017-02-05 LAB — URINALYSIS, COMPLETE (UACMP) WITH MICROSCOPIC
BILIRUBIN URINE: NEGATIVE
Glucose, UA: NEGATIVE mg/dL
HGB URINE DIPSTICK: NEGATIVE
Ketones, ur: NEGATIVE mg/dL
NITRITE: NEGATIVE
PH: 7 (ref 5.0–8.0)
Protein, ur: NEGATIVE mg/dL
RBC / HPF: NONE SEEN RBC/hpf (ref 0–5)
SPECIFIC GRAVITY, URINE: 1.01 (ref 1.005–1.030)

## 2017-02-05 LAB — PREGNANCY, URINE: Preg Test, Ur: NEGATIVE

## 2017-02-05 MED ORDER — SULFAMETHOXAZOLE-TRIMETHOPRIM 800-160 MG PO TABS: 1.0000 | ORAL_TABLET | Freq: Two times a day (BID) | ORAL | 0 refills | Status: AC

## 2017-02-05 MED ORDER — CIPROFLOXACIN HCL 0.3 % OP SOLN: 2.0000 [drp] | Freq: Four times a day (QID) | OPHTHALMIC | 0 refills | Status: AC

## 2017-02-05 MED ORDER — FLUORESCEIN SODIUM 0.6 MG OP STRP: 1.0000 | ORAL_STRIP | Freq: Once | OPHTHALMIC | Status: DC

## 2017-02-05 MED ORDER — TETANUS-DIPHTH-ACELL PERTUSSIS 5-2.5-18.5 LF-MCG/0.5 IM SUSP
0.5000 mL | Freq: Once | INTRAMUSCULAR | Status: AC
  Administered 2017-02-05: 0.5 mL via INTRAMUSCULAR

## 2017-02-05 MED ORDER — TETRACAINE HCL 0.5 % OP SOLN: 1.0000 [drp] | Freq: Once | OPHTHALMIC | Status: DC

## 2017-02-05 NOTE — ED Triage Notes (Signed)
Pt presents with a red swollen left eye for the last 2 days, she says it has been real watery.   Pt c/o burning, cloudy urine for the last 4 days.

## 2017-02-05 NOTE — Discharge Instructions (Signed)
Take medication as prescribed. Rest. Drink plenty of fluids. Keep contacts out of left eye. Practice good hand hygiene.  You need to follow-up with ophthalmology in 1-2 days. See above as needed as discussed.  Follow up with your primary care physician this week as needed. Return to Urgent care for new or worsening concerns.

## 2017-02-05 NOTE — ED Notes (Signed)
Patient waited 15 minutes and no reactions were noted. Injection site is unremarkable. 

## 2017-02-05 NOTE — ED Provider Notes (Signed)
[facility] ____________________________________________  Time seen: Approximately 10:40 AM  I have reviewed the triage vital signs and the nursing notes.   HISTORY  Chief Complaint Eye Problem and Urinary Tract Infection  HPI Nicole Mcdaniel is a 21 y.o. female presenting for evaluation of left eye complaints as well as dysuria. Patient reports Saturday morning she woke up with some left eye irritation and felt like she had something in her left eye, reports she then irrigated the eye and it felt better. Reports yesterday her left eye again felt irritated, red and a mild aching. Denies drainage but reports eyes watering more. Reports she did take her contact out of her left eye on Saturday and has not placed back in. Denies trauma, foreign body, chemical exposure or other known trigger. Denies urinary complaints. Denies any facial swelling or redness. Unsure of last tetanus immunization. Patient states that she has no vision changes in her contact with still in place, states that she is unable to assess now has she does not have her glasses with her. Denies obvious vision changes per patient. Denies recent sickness, cough, congestion.  Patient also reports for 3-4 days she has had some urinary frequency, urinary urgency and some burning with urination. Also reports feels that her urine has been cloudy. Patient reports she does have a history of UTIs with last being approximately 6 months ago. Denies known trigger for UTIs. Reports she is sexually active with 1 partner. Reports uses condoms. Denies concerns of pregnancy. Denies any vaginal complaints, vaginal irritation, vaginal discharge or abnormal vaginal bleeding. Patient states current symptoms feel like previous UTIs. Denies renal insufficiency. Denies antibiotic resistance. Reports continues to eat and drink well. Denies fevers, back pain, nausea, vomiting or diarrhea.  Denies chest pain, shortness of breath, abdominal pain,  extremity pain, extremity swelling or rash. Denies recent sickness. Denies recent antibiotic use.    History reviewed. No pertinent past medical history.  Patient Active Problem List   Diagnosis Date Noted  . Smoking 05/13/2016  . Dysuria 05/12/2016  . Encounter for routine gynecological examination 05/12/2016  . Screen for STD (sexually transmitted disease) 05/12/2016  . Routine general medical examination at a health care facility 04/30/2016  . Headache(784.0) 07/31/2012  . Breast pain in female 07/05/2012  . Well adolescent visit 06/03/2012  . Heavy menses 06/03/2012  . ALLERGIC RHINITIS 01/21/2008    History reviewed. No pertinent surgical history.    Current Facility-Administered Medications:  .  fluorescein ophthalmic strip 1 strip, 1 strip, Left Eye, Once, Renford Dills, NP .  tetracaine (PONTOCAINE) 0.5 % ophthalmic solution 1-2 drop, 1-2 drop, Left Eye, Once, Renford Dills, NP  Current Outpatient Prescriptions:  .  ciprofloxacin (CILOXAN) 0.3 % ophthalmic solution, Place 2 drops into the left eye 4 (four) times daily. For 5 days, Disp: 5 mL, Rfl: 0 .  levonorgestrel-ethinyl estradiol (LESSINA-28) 0.1-20 MG-MCG tablet, Take 1 tablet by mouth daily. (Patient not taking: Reported on 12/28/2016), Disp: 1 Package, Rfl: 11 .  sulfamethoxazole-trimethoprim (BACTRIM DS,SEPTRA DS) 800-160 MG tablet, Take 1 tablet by mouth 2 (two) times daily., Disp: 6 tablet, Rfl: 0  Allergies Patient has no known allergies.  History reviewed. No pertinent family history.  Social History Social History  Substance Use Topics  . Smoking status: Current Every Day Smoker    Packs/day: 0.75    Types: Cigarettes  . Smokeless tobacco: Never Used  . Alcohol use 0.0 oz/week     Comment: occ    Review of Systems  Constitutional: No fever/chills Eyes: As above. Cardiovascular: Denies chest pain. Respiratory: Denies shortness of breath. Gastrointestinal: No abdominal pain.  No nausea, no  vomiting.  No diarrhea.  No constipation. Genitourinary: Negative for dysuria. Musculoskeletal: Positive for back pain. Skin: Negative for rash.   ____________________________________________   PHYSICAL EXAM:  VITAL SIGNS: ED Triage Vitals  Enc Vitals Group     BP 02/05/17 0955 116/80     Pulse Rate 02/05/17 0955 61     Resp 02/05/17 0955 18     Temp 02/05/17 0955 98.2 F (36.8 C)     Temp Source 02/05/17 0955 Oral     SpO2 02/05/17 0955 100 %     Weight 02/05/17 0953 135 lb (61.2 kg)     Height 02/05/17 0953  (1.651 m)     Head Circumference --      Peak Flow --      Pain Score 02/05/17 0954 7     Pain Loc --      Pain Edu? --      Excl. in GC? --      Constitutional: Alert and oriented. Well appearing and in no acute distress. Eyes:  PERRL. EOMI.No pain with EOMs. Right: Normal conjunctivae, no foreign body visualized, no exudate. Left: Mild to moderate injection, no foreign body visualized, no exudate. Bilateral eyes nontender and no surrounding tenderness or erythema. Left eye examined with fluorescein and Wood's lamp, small corneal abrasion noted 11 o'clock, no foreign bodies noted. ENT      Head: Normocephalic and atraumatic.     Ears: Nontender, no erythema, normal TMs bilaterally.      Nose: No congestion/rhinnorhea.      Mouth/Throat: Mucous membranes are moist.Oropharynx non-erythematous. Cardiovascular: Normal rate, regular rhythm. Grossly normal heart sounds.  Good peripheral circulation. Respiratory: Normal respiratory effort without tachypnea nor retractions. Breath sounds are clear and equal bilaterally. No wheezes, rales, rhonchi. Gastrointestinal: Soft and nontender. No distention.  No CVA tenderness. Musculoskeletal:No midline cervical, thoracic or lumbar tenderness to palpation.  Neurologic:  Normal speech and language. Speech is normal. No gait instability.  Skin:  Skin is warm, dry and intact. No rash noted. Psychiatric: Mood and affect are  normal. Speech and behavior are normal. Patient exhibits appropriate insight and judgment   ___________________________________________   LABS (all labs ordered are listed, but only abnormal results are displayed)  Labs Reviewed  URINALYSIS, COMPLETE (UACMP) WITH MICROSCOPIC - Abnormal; Notable for the following:       Result Value   Color, Urine STRAW (*)    Leukocytes, UA TRACE (*)    Squamous Epithelial / LPF 6-30 (*)    Bacteria, UA RARE (*)    All other components within normal limits  URINE CULTURE  PREGNANCY, URINE   ____________________________________________  PROCEDURES Procedures   Eye exam Procedure explained and verbal consent obtained.  Anesthesia: tetracaine ophthalmic 2 drops Left eye examined with fluorescein strip and woods lamp.   No foreign bodies visualized. Small corneal abrasion noted to 11 o'clock.  Patient tolerated well.   INITIAL IMPRESSION / ASSESSMENT AND PLAN / ED COURSE  Pertinent labs & imaging results that were available during my care of the patient were reviewed by me and considered in my medical decision making (see chart for details).   well-appearing patient. No acute distress. Contact lens user and reports that she sleeps in contacts and wear some longer than recommended. States that she does not currently have glasses as "her dog ate them ".  [facility] with [facility] and recommended to patient to call ophthalmology today and schedule an appointment for tomorrow or the next day and if they were unable to see her to follow-up within the same timeframe. Recommend no use of contacts to left eye and will start patient on Cipro ophthalmic drops. Patient also with complaints of dysuria, and discussed in detail with patient her urine is not indicative of clear UTI. Also counseled regarding avoidance of any contact irritants. Patient declines any other trigger and states similar to previous UTIs. Will empirically treat patient with 3 day  course of Bactrim and culture urine. Encouraged rest, fluids and supportive care. Discussed indication, risks and benefits of medications with patient.Tetanus immunization updated.  Discussed follow up with Primary care physician this week. Discussed follow up and return parameters including no resolution or any worsening concerns. Patient verbalized understanding and agreed to plan.   ____________________________________________   FINAL CLINICAL IMPRESSION(S) / ED DIAGNOSES  Final diagnoses:  Dysuria  Abrasion of left cornea, initial encounter     New Prescriptions   CIPROFLOXACIN (CILOXAN) 0.3 % OPHTHALMIC SOLUTION    Place 2 drops into the left eye 4 (four) times daily. For 5 days   SULFAMETHOXAZOLE-TRIMETHOPRIM (BACTRIM DS,SEPTRA DS) 800-160 MG TABLET    Take 1 tablet by mouth 2 (two) times daily.    Note: This dictation was prepared with Dragon dictation along with smaller phrase technology. Any transcriptional errors that result from this process are unintentional.         Renford Dills, NP 02/05/17 1107    Renford Dills, NP 02/05/17 1108

## 2017-02-07 LAB — URINE CULTURE

## 2017-03-26 ENCOUNTER — Encounter: Payer: Self-pay | Admitting: Internal Medicine

## 2017-03-26 ENCOUNTER — Ambulatory Visit (INDEPENDENT_AMBULATORY_CARE_PROVIDER_SITE_OTHER): Payer: 59 | Admitting: Internal Medicine

## 2017-03-26 VITALS — BP 122/76 | HR 96 | Temp 98.5°F | Resp 12 | Wt 136.0 lb

## 2017-03-26 DIAGNOSIS — J029 Acute pharyngitis, unspecified: Secondary | ICD-10-CM | POA: Insufficient documentation

## 2017-03-26 LAB — POCT RAPID STREP A (OFFICE): Rapid Strep A Screen: NEGATIVE

## 2017-03-26 NOTE — Assessment & Plan Note (Signed)
0-1 Centor criteria Negative rapid test Discussed apparent viral etiology Supportive care and time course discussed

## 2017-03-26 NOTE — Progress Notes (Signed)
   Subjective:    Patient ID: Nicole Mcdaniel, female    DOB: 06/22/1996, 21 y.o.   MRN: [id number]  HPI Here due to sore throat  Started yesterday and low grade fever last night Mostly on right side--she saw white patches there Low back sore--and down into her legs  Some cough--- goes back abut 2 months (related to allergies), no meds for this No SOB No sweats or chills AM head congestion for 2 days. Slight rhinorrhea Slight right ear pain--brief and sharp  No meds for this No ill exposures  No current outpatient prescriptions on file prior to visit.   No current facility-administered medications on file prior to visit.     No Known Allergies  No past medical history on file.  No past surgical history on file.  No family history on file.  Social History   Social History  . Marital status: Single    Spouse name: N/A  . Number of children: N/A  . Years of education: N/A   Occupational History  . Greeter     [employer]   Social History Main Topics  . Smoking status: Current Every Day Smoker    Packs/day: 0.75    Types: Cigarettes  . Smokeless tobacco: Never Used  . Alcohol use 0.0 oz/week     Comment: occ  . Drug use: No  . Sexual activity: Not on file   Other Topics Concern  . Not on file   Social History Narrative  . No narrative on file   Review of Systems No rash Works at [employer] No N/V/diarrhea Appetite is okay    Objective:   Physical Exam  Constitutional: She appears well-nourished. No distress.  HENT:  No sinus tenderness TMs normal Mild nasal inflammation 1+ tonsils with injection but no exudate  Neck: Normal range of motion. No thyromegaly present.  Pulmonary/Chest: Effort normal and breath sounds normal. No respiratory distress. She has no wheezes. She has no rales.  Lymphadenopathy:    She has no cervical adenopathy.  Skin: No rash noted.          Assessment & Plan:

## 2017-03-27 ENCOUNTER — Ambulatory Visit
Admission: EM | Admit: 2017-03-27 | Discharge: 2017-03-27 | Disposition: A | Discharge status: Home / Self Care | Payer: 59 | Attending: Family Medicine | Admitting: Family Medicine

## 2017-03-27 DIAGNOSIS — J029 Acute pharyngitis, unspecified: Secondary | ICD-10-CM

## 2017-03-27 LAB — MONONUCLEOSIS SCREEN: Mono Screen: NEGATIVE

## 2017-03-27 LAB — RAPID STREP SCREEN (MED CTR MEBANE ONLY): STREPTOCOCCUS, GROUP A SCREEN (DIRECT): NEGATIVE

## 2017-03-27 MED ORDER — LIDOCAINE VISCOUS 2 % MT SOLN: OROMUCOSAL | 0 refills | Status: DC

## 2017-03-27 NOTE — ED Triage Notes (Addendum)
Pt reports "sinus pressure" and pain behind right eye and in right ear. Sore throat pain 7/10. Pain in throat is also more right sided. Seen by PCP yesterday and had negative throat swab done there.

## 2017-03-27 NOTE — ED Provider Notes (Signed)
[facility]    CSN: [id number] Arrival date & time: 03/27/17  1105     History   Chief Complaint Chief Complaint  Patient presents with  . Facial Pain    HPI MAEVEN Mcdaniel is a 21 y.o. female.   The history is provided by the patient.  URI  Presenting symptoms: congestion, ear pain, fever and sore throat   Severity:  Moderate Onset quality:  Sudden Duration:  4 days Timing:  Constant Progression:  Worsening Chronicity:  New Relieved by:  None tried Worsened by:  Nothing Associated symptoms: swollen glands   Associated symptoms: no headaches, no myalgias, no neck pain, no sinus pain and no wheezing   Risk factors: not elderly, no chronic cardiac disease, no chronic kidney disease, no chronic respiratory disease, no immunosuppression, no recent illness, no recent travel and no sick contacts     History reviewed. No pertinent past medical history.  Patient Active Problem List   Diagnosis Date Noted  . Sore throat 03/26/2017  . Smoking 05/13/2016  . Dysuria 05/12/2016  . Encounter for routine gynecological examination 05/12/2016  . Screen for STD (sexually transmitted disease) 05/12/2016  . Routine general medical examination at a health care facility 04/30/2016  . Headache(784.0) 07/31/2012  . Breast pain in female 07/05/2012  . Well adolescent visit 06/03/2012  . Heavy menses 06/03/2012  . ALLERGIC RHINITIS 01/21/2008    Past Surgical History:  Procedure Laterality Date  . WISDOM TOOTH EXTRACTION      OB History    No data available       Home Medications    Prior to Admission medications   Medication Sig Start Date End Date Taking? Authorizing Provider  lidocaine (XYLOCAINE) 2 % solution 20 mls gargle and spit q 6 hours prn sore throat 03/27/17   Nicole Mccallum, MD    Family History History reviewed. No pertinent family history.  Social History Social History  Substance Use Topics  . Smoking status: Current Every Day Smoker   Packs/day: 0.75    Types: Cigarettes  . Smokeless tobacco: Never Used  . Alcohol use 0.0 oz/week     Comment: occ     Allergies   Patient has no known allergies.   Review of Systems Review of Systems  Constitutional: Positive for fever.  HENT: Positive for congestion, ear pain and sore throat. Negative for sinus pain.   Respiratory: Negative for wheezing.   Musculoskeletal: Negative for myalgias and neck pain.  Neurological: Negative for headaches.     Physical Exam Triage Vital Signs ED Triage Vitals  Enc Vitals Group     BP 03/27/17 1225 124/77     Pulse Rate 03/27/17 1225 81     Resp 03/27/17 1225 16     Temp 03/27/17 1225 99.1 F (37.3 C)     Temp Source 03/27/17 1225 Oral     SpO2 03/27/17 1225 100 %     Weight 03/27/17 1222 136 lb (61.7 kg)     Height 03/27/17 1222 5\' 5"  (1.651 m)     Head Circumference --      Peak Flow --      Pain Score 03/27/17 1222 7     Pain Loc --      Pain Edu? --      Excl. in GC? --    No data found.   Updated Vital Signs BP 124/77 (BP Location: Right Arm)   Pulse 81   Temp 99.1 F (37.3 C) (Oral)  Resp 16   Ht 5\' 5"  (1.651 m)   Wt 136 lb (61.7 kg)   LMP 03/15/2017   SpO2 100%   BMI 22.63 kg/m   Visual Acuity Right Eye Distance:   Left Eye Distance:   Bilateral Distance:    Right Eye Near:   Left Eye Near:    Bilateral Near:     Physical Exam   UC Treatments / Results  Labs (all labs ordered are listed, but only abnormal results are displayed) Labs Reviewed  RAPID STREP SCREEN (NOT [facility])  CULTURE, GROUP A STREP [facility])  MONONUCLEOSIS SCREEN    EKG  EKG Interpretation None       Radiology No results found.  Procedures Procedures (including critical care time)  Medications Ordered in UC Medications - No data to display   Initial Impression / Assessment and Plan / UC Course  I have reviewed the triage vital signs and the nursing notes.  Pertinent labs & imaging results that were  available during my care of the patient were reviewed by me and considered in my medical decision making (see chart for details).        Final Clinical Impressions(s) / UC Diagnoses   Final diagnoses:  Viral pharyngitis    New Prescriptions Discharge Medication List as of 03/27/2017  2:14 PM    START taking these medications   Details  lidocaine (XYLOCAINE) 2 % solution 20 mls gargle and spit q 6 hours prn sore throat, Normal       1. Lab result (negative strep and mono) and diagnosis reviewed with patient 2. rx as per orders above; reviewed possible side effects, interactions, risks and benefits  3. Recommend supportive treatment with otc analgesics, rest, fluids 4. Follow-up prn if symptoms worsen or don't improve   Nicole Mccallumonty, Cutberto Winfree, MD 03/27/17 1438

## 2017-03-29 ENCOUNTER — Telehealth: Payer: Self-pay

## 2017-03-29 LAB — CULTURE, GROUP A STREP (THRC)

## 2017-03-29 MED ORDER — PENICILLIN V POTASSIUM 500 MG PO TABS: 500.0000 mg | ORAL_TABLET | Freq: Three times a day (TID) | ORAL | 0 refills | Status: DC

## 2017-03-29 NOTE — Telephone Encounter (Signed)
Pt s mom left v/m; pt was seen on 03/26/17; pt continues with white spots at back of throat,throat appears swollen, pt can barely talk; worse than when seen on 03/26/17. Request cb to pt.[facility] [address]

## 2017-03-29 NOTE — Telephone Encounter (Signed)
Spoke to pt. Told her to let us know if she misses work.

## 2017-03-29 NOTE — Telephone Encounter (Signed)
Please let them know that the culture from the ER showed some strep--but a different type than that that causes typical strep throat. Just in case, I sent a prescription for some penicillin for her to try to see if that helps

## 2017-05-07 DIAGNOSIS — H5203 Hypermetropia, bilateral: Secondary | ICD-10-CM | POA: Diagnosis not present

## 2017-07-21 ENCOUNTER — Ambulatory Visit
Admission: EM | Admit: 2017-07-21 | Discharge: 2017-07-21 | Disposition: A | Discharge status: Home / Self Care | Payer: BLUE CROSS/BLUE SHIELD | Attending: Family Medicine | Admitting: Family Medicine

## 2017-07-21 ENCOUNTER — Ambulatory Visit (INDEPENDENT_AMBULATORY_CARE_PROVIDER_SITE_OTHER): Payer: BLUE CROSS/BLUE SHIELD

## 2017-07-21 DIAGNOSIS — S46912A Strain of unspecified muscle, fascia and tendon at shoulder and upper arm level, left arm, initial encounter: Secondary | ICD-10-CM

## 2017-07-21 DIAGNOSIS — M25512 Pain in left shoulder: Secondary | ICD-10-CM | POA: Diagnosis not present

## 2017-07-21 DIAGNOSIS — M5410 Radiculopathy, site unspecified: Secondary | ICD-10-CM

## 2017-07-21 MED ORDER — MELOXICAM 7.5 MG PO TABS: 7.5000 mg | ORAL_TABLET | Freq: Two times a day (BID) | ORAL | 0 refills | Status: DC | PRN

## 2017-07-21 NOTE — ED Triage Notes (Signed)
As per patient Left shoulder and neck pain which rediate down to her middle and pinky fingers has numb and tingling sensation at work it's worst intermittent pain.

## 2017-07-21 NOTE — ED Provider Notes (Signed)
[facility]    CSN: [id number] Arrival date & time: 07/21/17  1144     History   Chief Complaint Chief Complaint  Patient presents with  . Shoulder Pain    HPI Nicole Mcdaniel is a 21 y.o. female.   [name] who presents with complaint of left shoulder pain that has been off and on for 1-2 months. Patient states originally felt like she pulled something but now include shooting pain up to her neck or back. 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 Patient reports last night she had numbness and tingling last 3 fingers of her left hand yesterday. Patient does not remember any actual injury or trauma to the arm. Patient states she's tried Tylenol and Aleve without help. She reports that it does get better after resting when she leaves work.      History reviewed. No pertinent past medical history.  Patient Active Problem List   Diagnosis Date Noted  . Sore throat 03/26/2017  . Smoking 05/13/2016  . Dysuria 05/12/2016  . Encounter for routine gynecological examination 05/12/2016  . Screen for STD (sexually transmitted disease) 05/12/2016  . Routine general medical examination at a health care facility 04/30/2016  . Headache(784.0) 07/31/2012  . Breast pain in female 07/05/2012  . Well adolescent visit 06/03/2012  . Heavy menses 06/03/2012  . ALLERGIC RHINITIS 01/21/2008    Past Surgical History:  Procedure Laterality Date  . WISDOM TOOTH EXTRACTION      OB History    No data available       Home Medications    Prior to Admission medications   Medication Sig Start Date End Date Taking? Authorizing Provider  lidocaine (XYLOCAINE) 2 % solution 20 mls gargle and spit q 6 hours prn sore throat 03/27/17   Payton Mccallum, MD  meloxicam (MOBIC) 7.5 MG tablet Take 1 tablet (7.5 mg total) by mouth every 12 (twelve)  hours as needed for pain (shoulder pain). 07/21/17   Candis Schatz, PA-C  penicillin v potassium (VEETID) 500 MG tablet Take 1 tablet (500 mg total) by mouth 3 (three) times daily. 03/29/17   Karie Schwalbe, MD    Family History History reviewed. No pertinent family history.  Social History Social History  Substance Use Topics  . Smoking status: Current Every Day Smoker    Packs/day: 0.75    Types: Cigarettes  . Smokeless tobacco: Current User  . Alcohol use 0.0 oz/week     Comment: occ     Allergies   Patient has no known allergies.   Review of Systems Review of Systems  As noted above in history of present illness. Other system reviewed and found to be negative.   Physical Exam Triage Vital Signs ED Triage Vitals  Enc Vitals Group     BP 07/21/17 1250 116/69     Pulse Rate 07/21/17 1250 66     Resp 07/21/17 1250 16     Temp 07/21/17 1250 98.4 F (36.9 C)     Temp Source 07/21/17 1250 Oral     SpO2 07/21/17 1250 100 %     Weight 07/21/17 1252 135 lb (61.2 kg)     Height 07/21/17 1252  (1.651 m)     Head Circumference --      Peak Flow --      Pain  Score --      Pain Loc --      Pain Edu? --      Excl. in GC? --    No data found.   Updated Vital Signs BP 116/69 (BP Location: Left Arm)   Pulse 66   Temp 98.4 F (36.9 C) (Oral)   Resp 16   Ht  (1.651 m)   Wt 135 lb (61.2 kg)   LMP 06/29/2017 (Approximate)   SpO2 100%   BMI 22.47 kg/m   Visual Acuity Right Eye Distance:   Left Eye Distance:   Bilateral Distance:    Right Eye Near:   Left Eye Near:    Bilateral Near:     Physical Exam  Constitutional: She appears well-developed and well-nourished. No distress.  HENT:  Head: Normocephalic and atraumatic.  Eyes: Pupils are equal, round, and reactive to light. EOM are normal.  Neck: Normal range of motion. Neck supple.  Cardiovascular: Normal rate.  Exam reveals no friction rub.   No murmur heard. Pulmonary/Chest: Effort normal.  No respiratory distress. She has no wheezes.  Musculoskeletal:       Left shoulder: She exhibits normal range of motion, no bony tenderness, no swelling and no crepitus.  Left shoulder with no pain to joint or scapula palpation. Patient reports some pain with abduction. No pain with internal/external rotation. No pain with forward flexion or back extension of the shoulder joint. With arms outstretched and patient has to push down up against resistance, this does elicit some pain. Also some pain with pushing up and down with his hands extended oil can position. Equal grip strength bilaterally. Sensation intact.     UC Treatments / Results  Labs (all labs ordered are listed, but only abnormal results are displayed) Labs Reviewed - No data to display  EKG  EKG Interpretation None       Radiology Dg Shoulder Left  Result Date: 07/21/2017 CLINICAL DATA:  Left shoulder pain for the past 2 months. No reported injury. EXAM: LEFT SHOULDER - 2+ VIEW COMPARISON:  None. FINDINGS: There is no evidence of fracture or dislocation. There is no evidence of arthropathy or other focal bone abnormality. Soft tissues are unremarkable. IMPRESSION: Normal examination. Electronically Signed   By: Beckie Salts M.D.   On: 07/21/2017 13:46    Procedures Procedures (including critical care time)  Medications Ordered in UC Medications - No data to display   Initial Impression / Assessment and Plan / UC Course  I have reviewed the triage vital signs and the nursing notes.  Pertinent labs & imaging results that were available during my care of the patient were reviewed by me and considered in my medical decision making (see chart for details).    Patient describes intermittent shoulder pain with intermittent radiculopathy ongoing 1-2 months. Reports it is worse at work or she would have Continuous arm movements. But improves afterwards. X-ray of the shoulder With no acute injury. Likely a shoulder strain  with occasional nerve irritation. Will give patient a sling recommend rest ice and elevation as needed. Recommend her using a sling as able at work until improved. Will give patient prescription for Mobic 7.5 mg that she can use every 12 hours as needed for pain. Patient states that issues recommend she follow with her primary care provider for further evaluation including possible more advanced imaging or physical therapy..  Final Clinical Impressions(s) / UC Diagnoses   Final diagnoses:  Strain of left shoulder, initial encounter  Radiculopathy, unspecified spinal region    New Prescriptions New Prescriptions   MELOXICAM (MOBIC) 7.5 MG TABLET    Take 1 tablet (7.5 mg total) by mouth every 12 (twelve) hours as needed for pain (shoulder pain).     Controlled Substance Prescriptions [place] Controlled Substance Registry consulted? Not Applicable   Candis Schatz, PA-C    Candis Schatz, PA-C 07/21/17 1350

## 2017-07-21 NOTE — Discharge Instructions (Signed)
-  meloxicam: one tablet twice a day as needed for pain -rest, ice, elevation as above -wear shoulder sling while up and moving around as much as possible for shoulder support. -if no improvement in 4-5 days, would follow up with PCP for further evaluation, including further advanced imaging or possible physical therapy

## 2017-07-27 ENCOUNTER — Emergency Department
Admission: EM | Admit: 2017-07-27 | Discharge: 2017-07-28 | Disposition: A | Discharge status: Home / Self Care | Payer: BLUE CROSS/BLUE SHIELD | Attending: Emergency Medicine | Admitting: Emergency Medicine

## 2017-07-27 ENCOUNTER — Encounter: Payer: Self-pay | Admitting: Emergency Medicine

## 2017-07-27 DIAGNOSIS — F17210 Nicotine dependence, cigarettes, uncomplicated: Secondary | ICD-10-CM | POA: Insufficient documentation

## 2017-07-27 DIAGNOSIS — M5412 Radiculopathy, cervical region: Secondary | ICD-10-CM | POA: Insufficient documentation

## 2017-07-27 DIAGNOSIS — M25512 Pain in left shoulder: Secondary | ICD-10-CM | POA: Insufficient documentation

## 2017-07-27 MED ORDER — NABUMETONE 750 MG PO TABS: 750.0000 mg | ORAL_TABLET | Freq: Two times a day (BID) | ORAL | 0 refills | Status: DC

## 2017-07-27 MED ORDER — CYCLOBENZAPRINE HCL 5 MG PO TABS: 5.0000 mg | ORAL_TABLET | Freq: Three times a day (TID) | ORAL | 0 refills | Status: DC | PRN

## 2017-07-27 NOTE — Discharge Instructions (Signed)
Your shoulder exam and x-ray are essentially normal. There is no indication of rotator cuff tendinitis, or arthritis. Your symptoms may represent some nerve irritation from the neck (cervical spine). Take the prescription meds as directed. Consider some range of motion exercises and stretches, as demonstrated. Follow-up with ortho in 2 weeks if symptoms don't improve. Return to the ED or [facility] as needed.

## 2017-07-27 NOTE — ED Provider Notes (Signed)
[facility] Emergency Department Provider Note ____________________________________________  Time seen: 2318  I have reviewed the triage vital signs and the nursing notes.  HISTORY  Chief Complaint  Shoulder Pain  HPI Nicole Mcdaniel is a 21 y.o. female Presents to the ED for evaluation of intermittent left shoulder pain for the last 2 months. Patient was seen at [facility] urgent [facility] on 06/2917 for same complaint. Her x-rays were read as negative and she was discharged with a shoulder strain and prescribed meloxicam. She admits that she did not fill the prescription because she didn't believe it would help. She did take Tylenol without any significant benefit. She denies any recent injury, accident, trauma, or history or sports injury. She also denies any previous history of shoulder problems.she is also has some intermittent paresthesias to the middle, ring, and pinky finger of the left hand. She localizes pain to the upper trapezius and supination is region of the shoulder. 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  History reviewed. No pertinent past medical history.  Patient Active Problem List   Diagnosis Date Noted  . Sore throat 03/26/2017  . Smoking 05/13/2016  . Dysuria 05/12/2016  . Encounter for routine gynecological examination 05/12/2016  . Screen for STD (sexually transmitted disease) 05/12/2016  . Routine general medical examination at a health care facility 04/30/2016  . Headache(784.0) 07/31/2012  . Breast pain in female 07/05/2012  . Well adolescent visit 06/03/2012  . Heavy menses 06/03/2012  . ALLERGIC RHINITIS 01/21/2008    Past Surgical History:  Procedure Laterality Date  . WISDOM TOOTH EXTRACTION      Prior to Admission medications   Medication Sig Start Date End Date Taking? Authorizing Provider  cyclobenzaprine (FLEXERIL) 5 MG tablet Take 1 tablet (5 mg total) by  mouth 3 (three) times daily as needed for muscle spasms. 07/27/17   Jessika Rothery, Charlesetta Ivory, PA-C  nabumetone (RELAFEN) 750 MG tablet Take 1 tablet (750 mg total) by mouth 2 (two) times daily. 07/27/17   Amaris Delafuente, Charlesetta Ivory, PA-C    Allergies Patient has no known allergies.  History reviewed. No pertinent family history.  Social History Social History  Substance Use Topics  . Smoking status: Current Every Day Smoker    Packs/day: 0.75    Types: Cigarettes  . Smokeless tobacco: Never Used  . Alcohol use 0.0 oz/week     Comment: occ    Review of Systems  Constitutional: Negative for fever. Cardiovascular: Negative for chest pain. Respiratory: Negative for shortness of breath. Musculoskeletal: Negative for back pain. Left shoulder pain as above. Skin: Negative for rash. Neurological: Negative for headaches, focal weakness or numbness. Left upper extremity paresthesias as above. ____________________________________________  PHYSICAL EXAM:  VITAL SIGNS: ED Triage Vitals  Enc Vitals Group     BP 07/27/17 2215 (!) 143/68     Pulse Rate 07/27/17 2215 71     Resp 07/27/17 2215 12     Temp 07/27/17 2215 98.3 F (36.8 C)     Temp Source 07/27/17 2215 Oral     SpO2 07/27/17 2215 97 %     Weight 07/27/17 2216 135 lb (61.2 kg)     Height 07/27/17 2216  (1.651 m)     Head Circumference --      Peak Flow --      Pain Score 07/27/17 2220 6     Pain Loc --  Pain Edu? --      Excl. in GC? --     Constitutional: Alert and oriented. Well appearing and in no distress. Head: Normocephalic and atraumatic. Eyes: Conjunctivae are normal. Normal extraocular movements Neck: Supple. No thyromegaly. Cardiovascular: Normal rate, regular rhythm. Normal distal pulses. Respiratory: Normal respiratory effort. No wheezes/rales/rhonchi. Musculoskeletal: left shoulder without any obvious deformity, dislocation, or sulcus sign. Normal active range of motion in all planes without  deficit. Normal rotator cuff testing on exam. Negative empty can test. Normal internal and external rotation without sign of impingement. Nontender with normal range of motion in all other extremities.  Neurologic: cranial nerves II through XII grossly intact. Normal intrinsic and opposition testing. Normal UE DTRs bilaterally. Negative cubital Tinel's. Negative carpal Tinel's. Normal gait without ataxia. Normal speech and language. No gross focal neurologic deficits are appreciated. Skin:  Skin is warm, dry and intact. No rash noted. ____________________________________________  INITIAL IMPRESSION / ASSESSMENT AND PLAN / ED COURSE  Patient with the ED evaluation of intermittent left shoulder pain over the last 2 months. Her symptoms include myalgias and paresthesias of the left upper extremity. Her symptoms may indicate a cervical radiculopathy involving the C8 nerve root. There is less concern for a rotator cuff arthropathy given her exam findings which did not indicate any significant tendinitis, bursitis, or tear. Patient is encouraged to dose to previously prescribed meloxicam as directed. She is also advised to perform stretches and range of motion exercises. She is advised to follow-up with orthopedics if her symptoms do not respond to the medications in 1-2 weeks. ____________________________________________  FINAL CLINICAL IMPRESSION(S) / ED DIAGNOSES  Final diagnoses:  Acute pain of left shoulder  Cervical radiculopathy      Karmen Stabs, Charlesetta Ivory, PA-C 07/28/17 0004    Myrna Blazer, MD 07/28/17 [phone]

## 2017-07-27 NOTE — ED Triage Notes (Signed)
Pt c/o intermittent left shoulder pain for 2 months; was seen at [facility] urgent care on   07/21/17 for same; xrays were negative; pt was prescribed Meloxicam which she admits she did not fill "because it's basically Tylenol and Tylenol doesn't help"; pt denies any injury; says she woke one morning with the pain and just assumed it was because of they way she slept; pt says pain fluctuates between burning, sharp and dull aching pain; pt in no acute distress;

## 2018-03-20 ENCOUNTER — Other Ambulatory Visit: Payer: Self-pay

## 2018-03-20 ENCOUNTER — Ambulatory Visit
Admission: EM | Admit: 2018-03-20 | Discharge: 2018-03-20 | Disposition: A | Discharge status: Home / Self Care | Payer: BLUE CROSS/BLUE SHIELD | Attending: Family Medicine | Admitting: Family Medicine

## 2018-03-20 DIAGNOSIS — R05 Cough: Secondary | ICD-10-CM

## 2018-03-20 DIAGNOSIS — B9789 Other viral agents as the cause of diseases classified elsewhere: Secondary | ICD-10-CM

## 2018-03-20 DIAGNOSIS — J069 Acute upper respiratory infection, unspecified: Secondary | ICD-10-CM | POA: Diagnosis not present

## 2018-03-20 DIAGNOSIS — R0981 Nasal congestion: Secondary | ICD-10-CM

## 2018-03-20 LAB — RAPID STREP SCREEN (MED CTR MEBANE ONLY): STREPTOCOCCUS, GROUP A SCREEN (DIRECT): NEGATIVE

## 2018-03-20 MED ORDER — HYDROCOD POLST-CPM POLST ER 10-8 MG/5ML PO SUER: 5.0000 mL | Freq: Every evening | ORAL | 0 refills | Status: DC | PRN

## 2018-03-20 NOTE — ED Provider Notes (Signed)
[facility]    CSN: [id number] Arrival date & time: 03/20/18  1107     History   Chief Complaint Chief Complaint  Patient presents with  . Cough    HPI Nicole Mcdaniel is a 22 y.o. female.   The history is provided by the patient.  URI  Presenting symptoms: congestion, cough and rhinorrhea   Severity:  Moderate Onset quality:  Sudden Duration:  2 days Timing:  Constant Progression:  Worsening Chronicity:  New Relieved by:  None tried Ineffective treatments:  None tried Associated symptoms: no sinus pain and no wheezing   Risk factors: sick contacts   Risk factors: not elderly, no chronic cardiac disease, no chronic kidney disease, no chronic respiratory disease, no diabetes mellitus, no immunosuppression, no recent illness and no recent travel     History reviewed. No pertinent past medical history.  Patient Active Problem List   Diagnosis Date Noted  . Sore throat 03/26/2017  . Smoking 05/13/2016  . Dysuria 05/12/2016  . Encounter for routine gynecological examination 05/12/2016  . Screen for STD (sexually transmitted disease) 05/12/2016  . Routine general medical examination at a health care facility 04/30/2016  . Headache(784.0) 07/31/2012  . Breast pain in female 07/05/2012  . Well adolescent visit 06/03/2012  . Heavy menses 06/03/2012  . ALLERGIC RHINITIS 01/21/2008    Past Surgical History:  Procedure Laterality Date  . WISDOM TOOTH EXTRACTION      OB History   None      Home Medications    Prior to Admission medications   Medication Sig Start Date End Date Taking? Authorizing Provider  chlorpheniramine-HYDROcodone (TUSSIONEX PENNKINETIC ER) 10-8 MG/5ML SUER Take 5 mLs by mouth at bedtime as needed. 03/20/18   Payton Mccallum, MD  cyclobenzaprine (FLEXERIL) 5 MG tablet Take 1 tablet (5 mg total) by mouth 3 (three) times daily as needed for muscle spasms. 07/27/17   Menshew, Charlesetta Ivory, PA-C  nabumetone (RELAFEN) 750 MG tablet  Take 1 tablet (750 mg total) by mouth 2 (two) times daily. 07/27/17   Menshew, Charlesetta Ivory, PA-C    Family History History reviewed. No pertinent family history.  Social History Social History   Tobacco Use  . Smoking status: Current Every Day Smoker    Packs/day: 0.75    Types: Cigarettes  . Smokeless tobacco: Never Used  Substance Use Topics  . Alcohol use: Yes    Alcohol/week: 0.0 oz    Comment: occ  . Drug use: No     Allergies   Patient has no known allergies.   Review of Systems Review of Systems  HENT: Positive for congestion and rhinorrhea. Negative for sinus pain.   Respiratory: Positive for cough. Negative for wheezing.      Physical Exam Triage Vital Signs ED Triage Vitals  Enc Vitals Group     BP 03/20/18 1117 132/80     Pulse Rate 03/20/18 1117 93     Resp 03/20/18 1117 16     Temp 03/20/18 1117 98.5 F (36.9 C)     Temp Source 03/20/18 1117 Oral     SpO2 03/20/18 1117 98 %     Weight 03/20/18 1116 140 lb (63.5 kg)     Height 03/20/18 1116  (1.651 m)     Head Circumference --      Peak Flow --      Pain Score 03/20/18 1116 4     Pain Loc --      Pain  Edu? --      Excl. in GC? --    No data found.  Updated Vital Signs BP 132/80 (BP Location: Left Arm)   Pulse 93   Temp 98.5 F (36.9 C) (Oral)   Resp 16   Ht  (1.651 m)   Wt 140 lb (63.5 kg)   LMP 03/16/2018   SpO2 98%   BMI 23.30 kg/m   Visual Acuity Right Eye Distance:   Left Eye Distance:   Bilateral Distance:    Right Eye Near:   Left Eye Near:    Bilateral Near:     Physical Exam  Constitutional: She appears well-developed and well-nourished. No distress.  HENT:  Head: Normocephalic and atraumatic.  Right Ear: Tympanic membrane, external ear and ear canal normal.  Left Ear: Tympanic membrane, external ear and ear canal normal.  Nose: Mucosal edema and rhinorrhea present. No nose lacerations, sinus tenderness, nasal deformity, septal deviation or nasal septal  hematoma. No epistaxis.  No foreign bodies. Right sinus exhibits no maxillary sinus tenderness and no frontal sinus tenderness. Left sinus exhibits no maxillary sinus tenderness and no frontal sinus tenderness.  Mouth/Throat: Uvula is midline, oropharynx is clear and moist and mucous membranes are normal. No oropharyngeal exudate.  Eyes: Conjunctivae are normal. Right eye exhibits no discharge. Left eye exhibits no discharge. No scleral icterus.  Neck: Normal range of motion. Neck supple. No thyromegaly present.  Cardiovascular: Normal rate, regular rhythm and normal heart sounds.  Pulmonary/Chest: Effort normal and breath sounds normal. No stridor. No respiratory distress. She has no wheezes. She has no rales.  Lymphadenopathy:    She has no cervical adenopathy.  Skin: She is not diaphoretic.  Nursing note and vitals reviewed.    UC Treatments / Results  Labs (all labs ordered are listed, but only abnormal results are displayed) Labs Reviewed  RAPID STREP SCREEN ([facility] ONLY)  CULTURE, GROUP A STREP [facility])    EKG None  Radiology No results found.  Procedures Procedures (including critical care time)  Medications Ordered in UC Medications - No data to display  Initial Impression / Assessment and Plan / UC Course  I have reviewed the triage vital signs and the nursing notes.  Pertinent labs & imaging results that were available during my care of the patient were reviewed by me and considered in my medical decision making (see chart for details).      Final Clinical Impressions(s) / UC Diagnoses   Final diagnoses:  Viral URI with cough   Discharge Instructions   None    ED Prescriptions    Medication Sig Dispense Auth. Provider   chlorpheniramine-HYDROcodone (TUSSIONEX PENNKINETIC ER) 10-8 MG/5ML SUER Take 5 mLs by mouth at bedtime as needed. 30 mL Payton Mccallum, MD     1. Lab results and diagnosis reviewed with patient 2. rx as per orders above; reviewed  possible side effects, interactions, risks and benefits  3. Recommend supportive treatment with fluids, rest 4. Follow-up prn if symptoms worsen or don't improve   Controlled Substance Prescriptions [place] Controlled Substance Registry consulted? Not Applicable   Payton Mccallum, MD 03/20/18 1144

## 2018-03-20 NOTE — ED Triage Notes (Signed)
Pt with cough that started last p.m. Has "stuffy nose" and scratchy throat. Pain 4/10

## 2018-03-23 LAB — CULTURE, GROUP A STREP (THRC)

## 2018-07-03 DIAGNOSIS — M5387 Other specified dorsopathies, lumbosacral region: Secondary | ICD-10-CM | POA: Diagnosis not present

## 2018-07-03 DIAGNOSIS — M9903 Segmental and somatic dysfunction of lumbar region: Secondary | ICD-10-CM | POA: Diagnosis not present

## 2018-07-24 ENCOUNTER — Ambulatory Visit: Payer: BLUE CROSS/BLUE SHIELD | Admitting: Family Medicine

## 2018-07-24 ENCOUNTER — Encounter: Payer: Self-pay | Admitting: Family Medicine

## 2018-07-24 VITALS — BP 128/64 | HR 94 | Temp 98.9°F | Ht 65.0 in | Wt 133.5 lb

## 2018-07-24 DIAGNOSIS — B354 Tinea corporis: Secondary | ICD-10-CM | POA: Insufficient documentation

## 2018-07-24 MED ORDER — CICLOPIROX 0.77 % EX GEL: CUTANEOUS | 0 refills | Status: DC

## 2018-07-24 NOTE — Patient Instructions (Addendum)
Keep skin areas clean with soap and water  Try to keep as dry as possible  Cover lightly when needed   Try the Ciclopirox gel twice daily to affected areas   In 7 days if not improving let me know

## 2018-07-24 NOTE — Progress Notes (Signed)
Subjective:    Patient ID: Nicole Mcdaniel, female    DOB: 12/04/1995, 22 y.o.   MRN: [id number]  HPI  Here for skin issue -possible ringworm on both arms   Wt Readings from Last 3 Encounters:  07/24/18 133 lb 8 oz (60.6 kg)  03/20/18 140 lb (63.5 kg)  07/27/17 135 lb (61.2 kg)   22.22 kg/m   Current smoker   First noticed skin lesions early in august   Started as an itchy bump Now worse and worse  Itchy   Works at a [employer]-trying to keep it covered   Using clotrimazole and betamethasone 1%  (was her boyfriend's)-no imp   Patient Active Problem List   Diagnosis Date Noted  . Tinea corporis 07/24/2018  . Sore throat 03/26/2017  . Smoking 05/13/2016  . Encounter for routine gynecological examination 05/12/2016  . Screen for STD (sexually transmitted disease) 05/12/2016  . Routine general medical examination at a health care facility 04/30/2016  . Headache(784.0) 07/31/2012  . Breast pain in female 07/05/2012  . Well adolescent visit 06/03/2012  . Heavy menses 06/03/2012  . ALLERGIC RHINITIS 01/21/2008   History reviewed. No pertinent past medical history. Past Surgical History:  Procedure Laterality Date  . WISDOM TOOTH EXTRACTION     Social History   Tobacco Use  . Smoking status: Current Every Day Smoker    Packs/day: 0.75    Types: Cigarettes  . Smokeless tobacco: Never Used  Substance Use Topics  . Alcohol use: Yes    Alcohol/week: 0.0 standard drinks    Comment: occ  . Drug use: No   History reviewed. No pertinent family history. No Known Allergies No current outpatient medications on file prior to visit.   No current facility-administered medications on file prior to visit.      Review of Systems  Constitutional: Negative for fatigue and fever.  HENT: Negative for congestion, mouth sores and sore throat.   Eyes: Negative for discharge and itching.  Respiratory: Negative for cough and wheezing.   Cardiovascular: Negative for leg  swelling.  Gastrointestinal: Negative for abdominal pain and diarrhea.  Genitourinary: Negative for dysuria and frequency.  Musculoskeletal: Negative for arthralgias and back pain.  Skin: Positive for rash.  Neurological: Negative for numbness and headaches.  Hematological: Negative for adenopathy. Does not bruise/bleed easily.       Objective:   Physical Exam  Constitutional: She appears well-developed and well-nourished. No distress.  HENT:  Head: Normocephalic and atraumatic.  Mouth/Throat: Oropharynx is clear and moist.  Eyes: Pupils are equal, round, and reactive to light. Conjunctivae and EOM are normal.  Neck: Normal range of motion. Neck supple.  Cardiovascular: Normal rate, regular rhythm and normal heart sounds.  Musculoskeletal: She exhibits no deformity.  Lymphadenopathy:    She has no cervical adenopathy.  Skin: Skin is warm and dry.  Skin lesions on both arms resembling ringworm/tinea  2-3 cm- oval areas (scalloped raised borders with scale) pink in color with central clearing and some scabbing   Some irritation from adhesive/band aid     Psychiatric: She has a normal mood and affect.          Assessment & Plan:   Problem List Items Addressed This Visit      Musculoskeletal and Integument   Tinea corporis - Primary    Lesions on both arms resembling ringworm (in pt who works at [employer])  Has tried [employer] (borrowed) with no imp  Will try ciclopirox gel bid  inst to keep areas dry and covered when needed  Clean with soap and water  Update if not improving in 7 days       Relevant Medications   Ciclopirox 0.77 % gel

## 2018-07-24 NOTE — Assessment & Plan Note (Signed)
Lesions on both arms resembling ringworm (in pt who works at [employer])  Has tried Medtronic (borrowed) with no imp  Will try ciclopirox gel bid  inst to keep areas dry and covered when needed  Clean with soap and water  Update if not improving in 7 days

## 2018-08-22 ENCOUNTER — Telehealth: Payer: Self-pay | Admitting: Family Medicine

## 2018-08-22 DIAGNOSIS — Z0000 Encounter for general adult medical examination without abnormal findings: Secondary | ICD-10-CM

## 2018-08-22 NOTE — Telephone Encounter (Signed)
-----   Message from Wendi Maya, RT sent at 08/13/2018  9:53 AM EDT ----- Regarding: Lab orders for Friday Nov 1st Please enter CPE lab orders for 08/23/18. Thanks!

## 2018-08-23 ENCOUNTER — Other Ambulatory Visit (INDEPENDENT_AMBULATORY_CARE_PROVIDER_SITE_OTHER): Payer: BLUE CROSS/BLUE SHIELD

## 2018-08-23 DIAGNOSIS — Z0000 Encounter for general adult medical examination without abnormal findings: Secondary | ICD-10-CM

## 2018-08-23 LAB — COMPREHENSIVE METABOLIC PANEL
ALT: 9 U/L (ref 0–35)
AST: 11 U/L (ref 0–37)
Albumin: 4.6 g/dL (ref 3.5–5.2)
Alkaline Phosphatase: 60 U/L (ref 39–117)
BUN: 13 mg/dL (ref 6–23)
CALCIUM: 9.7 mg/dL (ref 8.4–10.5)
CHLORIDE: 103 meq/L (ref 96–112)
CO2: 27 mEq/L (ref 19–32)
Creatinine, Ser: 0.61 mg/dL (ref 0.40–1.20)
GFR: 128.82 mL/min (ref >60.00)
Glucose, Bld: 97 mg/dL (ref 70–99)
POTASSIUM: 3.6 meq/L (ref 3.5–5.1)
Sodium: 140 mEq/L (ref 135–145)
Total Bilirubin: 0.6 mg/dL (ref 0.2–1.2)
Total Protein: 7.3 g/dL (ref 6.0–8.3)

## 2018-08-23 LAB — CBC WITH DIFFERENTIAL/PLATELET
BASOS PCT: 0.3 % (ref 0.0–3.0)
Basophils Absolute: 0 10*3/uL (ref 0.0–0.1)
EOS PCT: 1 % (ref 0.0–5.0)
Eosinophils Absolute: 0.1 10*3/uL (ref 0.0–0.7)
HEMATOCRIT: 39.7 % (ref 36.0–46.0)
HEMOGLOBIN: 13.3 g/dL (ref 12.0–15.0)
LYMPHS PCT: 29.9 % (ref 12.0–46.0)
Lymphs Abs: 2.4 10*3/uL (ref 0.7–4.0)
MCHC: 33.7 g/dL (ref 30.0–36.0)
MCV: 86.8 fl (ref 78.0–100.0)
Monocytes Absolute: 0.7 10*3/uL (ref 0.1–1.0)
Monocytes Relative: 8.7 % (ref 3.0–12.0)
Neutro Abs: 4.8 10*3/uL (ref 1.4–7.7)
Neutrophils Relative %: 60.1 % (ref 43.0–77.0)
Platelets: 210 10*3/uL (ref 150.0–400.0)
RBC: 4.57 Mil/uL (ref 3.87–5.11)
RDW: 13.4 % (ref 11.5–15.5)
WBC: 8 10*3/uL (ref 4.0–10.5)

## 2018-08-23 LAB — LIPID PANEL
CHOL/HDL RATIO: 3
Cholesterol: 162 mg/dL (ref 0–200)
HDL: 58 mg/dL (ref >39.00)
LDL CALC: 86 mg/dL (ref 0–99)
NONHDL: 104.49
TRIGLYCERIDES: 92 mg/dL (ref 0.0–149.0)
VLDL: 18.4 mg/dL (ref 0.0–40.0)

## 2018-08-23 LAB — TSH: TSH: 3.13 u[IU]/mL (ref 0.35–4.50)

## 2018-08-27 ENCOUNTER — Ambulatory Visit (INDEPENDENT_AMBULATORY_CARE_PROVIDER_SITE_OTHER): Payer: BLUE CROSS/BLUE SHIELD | Admitting: Family Medicine

## 2018-08-27 ENCOUNTER — Other Ambulatory Visit (HOSPITAL_COMMUNITY)
Admission: RE | Admit: 2018-08-27 | Discharge: 2018-08-27 | Disposition: A | Discharge status: Home / Self Care | Payer: BLUE CROSS/BLUE SHIELD | Source: Ambulatory Visit | Attending: Family Medicine | Admitting: Family Medicine

## 2018-08-27 ENCOUNTER — Encounter: Payer: Self-pay | Admitting: Family Medicine

## 2018-08-27 VITALS — BP 122/68 | HR 64 | Temp 98.3°F | Ht 65.5 in | Wt 133.8 lb

## 2018-08-27 DIAGNOSIS — Z01419 Encounter for gynecological examination (general) (routine) without abnormal findings: Secondary | ICD-10-CM | POA: Insufficient documentation

## 2018-08-27 DIAGNOSIS — IMO0001 Reserved for inherently not codable concepts without codable children: Secondary | ICD-10-CM

## 2018-08-27 DIAGNOSIS — Z0000 Encounter for general adult medical examination without abnormal findings: Secondary | ICD-10-CM

## 2018-08-27 DIAGNOSIS — B354 Tinea corporis: Secondary | ICD-10-CM | POA: Diagnosis not present

## 2018-08-27 DIAGNOSIS — N920 Excessive and frequent menstruation with regular cycle: Secondary | ICD-10-CM

## 2018-08-27 DIAGNOSIS — F17200 Nicotine dependence, unspecified, uncomplicated: Secondary | ICD-10-CM

## 2018-08-27 DIAGNOSIS — Z23 Encounter for immunization: Secondary | ICD-10-CM | POA: Diagnosis not present

## 2018-08-27 NOTE — Assessment & Plan Note (Signed)
Reviewed health habits including diet and exercise and skin cancer prevention Reviewed appropriate screening tests for age  Also reviewed health mt list, fam hx and immunization status , as well as social and family history   See HPI Labs rev  Flu shot given Declines std screening  Gyn exam done

## 2018-08-27 NOTE — Assessment & Plan Note (Signed)
Nl cbc Pt tolerates No OC due to smoking  May consider progestin IUD in the future

## 2018-08-27 NOTE — Progress Notes (Signed)
Subjective:    Patient ID: Nicole Mcdaniel, female    DOB: 10-10-1996, 22 y.o.   MRN: [id number]  HPI  Here for health maintenance exam and to review chronic medical problems    Doing well  Working a lot  About to change jobs- will be working at [employer] in [place]  Now -working nights  Would like to work days- looking forward to it   [employer] from Last 3 Encounters:  08/27/18 133 lb 12 oz (60.7 kg)  07/24/18 133 lb 8 oz (60.6 kg)  03/20/18 140 lb (63.5 kg)  is eating - fine / good appetite (but eats less working nights)  Has a hard time keeping weight on  No regular exercise  21.92 kg/m   Flu vaccine- will get vaccine today   Pap 7/17 neg (had yeast)  Was here Menses - heavy / but regular -she tolerates them  Last about 5 days (heavy for first 3 days)  A lot of cramps  Not trying to get pregnant  Using condoms   Last time she took the pill- caused wt gain and also hair loss   Std screening -not interested   Tetanus shot 4/18   Smoking status - 1/2 ppd  Is interested in quitting  Has quit in the past for short amounts of time  Started back after being around people who smok   Not a lot of cravings Taste and habit are the worst    Labs  Results for orders placed or performed in visit on 08/23/18  TSH  Result Value Ref Range   TSH 3.13 0.35 - 4.50 uIU/mL  Lipid panel  Result Value Ref Range   Cholesterol 162 0 - 200 mg/dL   Triglycerides 40.9 0.0 - 149.0 mg/dL   HDL 81.19 >14.78 mg/dL   VLDL 29.5 0.0 - 62.1 mg/dL   LDL Cholesterol 86 0 - 99 mg/dL   Total CHOL/HDL Ratio 3    NonHDL 104.49   Comprehensive metabolic panel  Result Value Ref Range   Sodium 140 135 - 145 mEq/L   Potassium 3.6 3.5 - 5.1 mEq/L   Chloride 103 96 - 112 mEq/L   CO2 27 19 - 32 mEq/L   Glucose, Bld 97 70 - 99 mg/dL   BUN 13 6 - 23 mg/dL   Creatinine, Ser 3.08 0.40 - 1.20 mg/dL   Total Bilirubin 0.6 0.2 - 1.2 mg/dL   Alkaline Phosphatase 60 39 - 117 U/L   AST 11  0 - 37 U/L   ALT 9 0 - 35 U/L   Total Protein 7.3 6.0 - 8.3 g/dL   Albumin 4.6 3.5 - 5.2 g/dL   Calcium 9.7 8.4 - 65.7 mg/dL   GFR 846.96 >29.52 mL/min  CBC with Differential/Platelet  Result Value Ref Range   WBC 8.0 4.0 - 10.5 K/uL   RBC 4.57 3.87 - 5.11 Mil/uL   Hemoglobin 13.3 12.0 - 15.0 g/dL   HCT 84.1 32.4 - 40.1 %   MCV 86.8 78.0 - 100.0 fl   MCHC 33.7 30.0 - 36.0 g/dL   RDW 02.7 25.3 - 66.4 %   Platelets 210.0 150.0 - 400.0 K/uL   Neutrophils Relative % 60.1 43.0 - 77.0 %   Lymphocytes Relative 29.9 12.0 - 46.0 %   Monocytes Relative 8.7 3.0 - 12.0 %   Eosinophils Relative 1.0 0.0 - 5.0 %   Basophils Relative 0.3 0.0 - 3.0 %   Neutro Abs 4.8 1.4 -  7.7 K/uL   Lymphs Abs 2.4 0.7 - 4.0 K/uL   Monocytes Absolute 0.7 0.1 - 1.0 K/uL   Eosinophils Absolute 0.1 0.0 - 0.7 K/uL   Basophils Absolute 0.0 0.0 - 0.1 K/uL    Still has ringworm on both arms Much improved with ciprolox gel but not resolved  Frustrating   Patient Active Problem List   Diagnosis Date Noted  . Visit for routine gyn exam 08/27/2018  . Tinea corporis 07/24/2018  . Sore throat 03/26/2017  . Smoking 05/13/2016  . Encounter for routine gynecological examination 05/12/2016  . Screen for STD (sexually transmitted disease) 05/12/2016  . Routine general medical examination at a health care facility 04/30/2016  . Headache(784.0) 07/31/2012  . Breast pain in female 07/05/2012  . Well adolescent visit 06/03/2012  . Heavy menses 06/03/2012  . ALLERGIC RHINITIS 01/21/2008   History reviewed. No pertinent past medical history. Past Surgical History:  Procedure Laterality Date  . WISDOM TOOTH EXTRACTION     Social History   Tobacco Use  . Smoking status: Current Every Day Smoker    Packs/day: 0.75    Types: Cigarettes  . Smokeless tobacco: Never Used  Substance Use Topics  . Alcohol use: Yes    Alcohol/week: 0.0 standard drinks    Comment: occ  . Drug use: No   History reviewed. No pertinent  family history. No Known Allergies Current Outpatient Medications on File Prior to Visit  Medication Sig Dispense Refill  . Ciclopirox 0.77 % gel Apply topically to affected area twice daily (Patient taking differently: Apply topically to affected area daily as needed) 30 g 0   No current facility-administered medications on file prior to visit.      Review of Systems  Constitutional: Negative for activity change, appetite change, fatigue, fever and unexpected weight change.  HENT: Negative for congestion, ear pain, rhinorrhea, sinus pressure and sore throat.   Eyes: Negative for pain, redness and visual disturbance.  Respiratory: Negative for cough, shortness of breath and wheezing.   Cardiovascular: Negative for chest pain and palpitations.  Gastrointestinal: Negative for abdominal pain, blood in stool, constipation and diarrhea.  Endocrine: Negative for polydipsia and polyuria.  Genitourinary: Positive for menstrual problem. Negative for dysuria, frequency and urgency.  Musculoskeletal: Negative for arthralgias, back pain and myalgias.  Skin: Positive for rash. Negative for pallor.       Ringworm -arms improved but not gone  Allergic/Immunologic: Negative for environmental allergies.  Neurological: Negative for dizziness, syncope and headaches.  Hematological: Negative for adenopathy. Does not bruise/bleed easily.  Psychiatric/Behavioral: Negative for decreased concentration and dysphoric mood. The patient is not nervous/anxious.        Objective:   Physical Exam  Constitutional: She appears well-developed and well-nourished. No distress.  Slim and well appearing   HENT:  Head: Normocephalic and atraumatic.  Right Ear: External ear normal.  Left Ear: External ear normal.  Mouth/Throat: Oropharynx is clear and moist.  Eyes: Pupils are equal, round, and reactive to light. Conjunctivae and EOM are normal. No scleral icterus.  Neck: Normal range of motion. Neck supple. No JVD  present. Carotid bruit is not present. No thyromegaly present.  Cardiovascular: Normal rate, regular rhythm, normal heart sounds and intact distal pulses. Exam reveals no gallop.  Pulmonary/Chest: Effort normal and breath sounds normal. No respiratory distress. She has no wheezes. She exhibits no tenderness. No breast tenderness, discharge or bleeding.  Abdominal: Soft. Bowel sounds are normal. She exhibits no distension, no abdominal bruit and no  mass. There is no tenderness.  Genitourinary: No breast tenderness, discharge or bleeding.  Genitourinary Comments: Breast exam: No mass, nodules, thickening, tenderness, bulging, retraction, inflamation, nipple discharge or skin changes noted.  No axillary or clavicular LA.              Anus appears normal w/o hemorrhoids or masses     External genitalia : nl appearance and hair distribution/no lesions     Urethral meatus : nl size, no lesions or prolapse     Urethra: no masses, tenderness or scarring    Bladder : no masses or tenderness     Vagina: nl general appearance, no discharge or  Lesions, no significant cystocele  or rectocele     Cervix: no lesions/ discharge or friability    Uterus: nl size, contour, position, and mobility (not fixed) , non tender    Adnexa : no masses, tenderness, enlargement or nodularity        Musculoskeletal: Normal range of motion. She exhibits no edema or tenderness.  Lymphadenopathy:    She has no cervical adenopathy.  Neurological: She is alert. She has normal reflexes. She displays normal reflexes. No cranial nerve deficit. She exhibits normal muscle tone. Coordination normal.  Skin: Skin is warm and dry. Rash noted. No erythema. No pallor.  Few brown nevi on trunk Multiple tattoos   Several 2-3 cm areas of scale on both lower arms - with some healing papules and central clearing Improved from last exam  Psychiatric: She has a normal mood and affect. Her mood appears not anxious.  She does not exhibit a depressed mood.  Pleasant  Boyfriend with her today          Assessment & Plan:   Problem List Items Addressed This Visit      Musculoskeletal and Integument   Tinea corporis    Patches on both arms Works in a vet office Using ciclopirox gel- improved but not resolved  Using prn Adv to use bid on schedule for 1-2 wk If no further imp will ref to dermatology        Other   Heavy menses    Nl cbc Pt tolerates No OC due to smoking  May consider progestin IUD in the future       Routine general medical examination at a health care facility - Primary    Reviewed health habits including diet and exercise and skin cancer prevention Reviewed appropriate screening tests for age  Also reviewed health mt list, fam hx and immunization status , as well as social and family history   See HPI Labs rev  Flu shot given Declines std screening  Gyn exam done      Smoking    Disc in detail risks of smoking and possible outcomes including copd, vascular/ heart disease, cancer , respiratory and sinus infections  Pt voices understanding Strategy discussed re: being around other smokers May try nicotine gum  May consider chantix if needed as well       Visit for routine gyn exam    Exam and pap done / no abnormalities Using condoms for contraception  Smoker-hesitant to try OC May be interested in progestin IUD-given info on this  Declines STD screen/is monogamous   Unchanged heavy menses       Relevant Orders   Cytology - PAP    Other Visit Diagnoses    Need for influenza vaccination       Relevant Orders   Flu Vaccine QUAD  6+ mos PF IM (Fluarix Quad PF) (Completed)

## 2018-08-27 NOTE — Assessment & Plan Note (Signed)
Exam and pap done / no abnormalities Using condoms for contraception  Smoker-hesitant to try OC May be interested in progestin IUD-given info on this  Declines STD screen/is monogamous   Unchanged heavy menses

## 2018-08-27 NOTE — Assessment & Plan Note (Signed)
Disc in detail risks of smoking and possible outcomes including copd, vascular/ heart disease, cancer , respiratory and sinus infections  Pt voices understanding Strategy discussed re: being around other smokers May try nicotine gum  May consider chantix if needed as well

## 2018-08-27 NOTE — Patient Instructions (Addendum)
Set a quit date  Make a strategy for situations when you are around other smokers  Nicotine gum may be a great option for you (available over the counter)   If you want to try Chanix in the future let me know   Try to eat more fruits and vegetables and less junk food and fast food   Pap and gyn exam done today   One of the hormone IUDs like Mirena - may be a good option for contraception and period control  You do have to see gyn to have one placed   Stay active Take care of yourself Flu shot today   Keep using the gel on your rash areas If no further improvement in 2 weeks -call us for a dermatology referral

## 2018-08-27 NOTE — Assessment & Plan Note (Signed)
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

## 2018-08-28 LAB — CYTOLOGY - PAP
Diagnosis: NEGATIVE
HPV: NOT DETECTED

## 2018-09-18 ENCOUNTER — Telehealth: Payer: Self-pay | Admitting: *Deleted

## 2018-09-18 ENCOUNTER — Encounter: Payer: Self-pay | Admitting: Family Medicine

## 2018-09-18 ENCOUNTER — Ambulatory Visit (INDEPENDENT_AMBULATORY_CARE_PROVIDER_SITE_OTHER)
Admission: RE | Admit: 2018-09-18 | Discharge: 2018-09-18 | Disposition: A | Discharge status: Home / Self Care | Payer: BLUE CROSS/BLUE SHIELD | Source: Ambulatory Visit | Attending: Family Medicine | Admitting: Family Medicine

## 2018-09-18 ENCOUNTER — Ambulatory Visit: Payer: BLUE CROSS/BLUE SHIELD | Admitting: Family Medicine

## 2018-09-18 VITALS — BP 110/68 | HR 68 | Temp 98.3°F | Ht 65.5 in | Wt 134.8 lb

## 2018-09-18 DIAGNOSIS — G8929 Other chronic pain: Secondary | ICD-10-CM

## 2018-09-18 DIAGNOSIS — M545 Low back pain, unspecified: Secondary | ICD-10-CM

## 2018-09-18 DIAGNOSIS — B354 Tinea corporis: Secondary | ICD-10-CM

## 2018-09-18 DIAGNOSIS — R21 Rash and other nonspecific skin eruption: Secondary | ICD-10-CM | POA: Diagnosis not present

## 2018-09-18 MED ORDER — PERMETHRIN 5 % EX CREA: 1.0000 "application " | TOPICAL_CREAM | Freq: Once | CUTANEOUS | 0 refills | Status: AC

## 2018-09-18 MED ORDER — MELOXICAM 15 MG PO TABS: 15.0000 mg | ORAL_TABLET | Freq: Every day | ORAL | 1 refills | Status: DC | PRN

## 2018-09-18 MED ORDER — METHOCARBAMOL 500 MG PO TABS: 500.0000 mg | ORAL_TABLET | Freq: Three times a day (TID) | ORAL | 0 refills | Status: DC | PRN

## 2018-09-18 MED ORDER — TRIAMCINOLONE ACETONIDE 0.1 % EX CREA: 1.0000 "application " | TOPICAL_CREAM | Freq: Two times a day (BID) | CUTANEOUS | 0 refills | Status: DC

## 2018-09-18 MED ORDER — CICLOPIROX 0.77 % EX GEL: CUTANEOUS | 0 refills | Status: DC

## 2018-09-18 NOTE — Assessment & Plan Note (Signed)
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

## 2018-09-18 NOTE — Patient Instructions (Signed)
Use the permethrin cream as directed for possible scabies   Use the triamcinolone cream on itchy areas   The ciclopirox gel for ringworm   If no improvement - we will refer to dermatology (call and let me know)   For back pain  Try meloxicam instead of ibuprofen Methocarbamol- muscle relaxer- caution of sedation  Try some heat on back  Try the stretches Xray today - and we will alert you with result and plan

## 2018-09-18 NOTE — Progress Notes (Signed)
Subjective:    Patient ID: Nicole Mcdaniel, female    DOB: [birth year], 22 y.o.   MRN: [id number]  HPI  Here for rash on arms and back pain   Wt Readings from Last 3 Encounters:  09/18/18 134 lb 12 oz (61.1 kg)  08/27/18 133 lb 12 oz (60.7 kg)  07/24/18 133 lb 8 oz (60.6 kg)   22.08 kg/m   Hx of tinea corporis  Using ciclopirox gel - getting better   New rash/-spots on arms  Itchy Started about a week ago (? Bites)  Works at [employer] that she knows of   Had LS films w/o abn in 1/13 Low back pain  In the past PT helped  It has happened to her 3 times this year  Goes to chiropractor - and was told she has a pinched nerve  Manipulations help temporarily  Pain is right in the middle   Hurts to bend and twist  Lying in bed at night- is ok  Really hurts first thing in am -has to get moving  Pain rad into tailbone  occ a little to both legs   No weakness  No numbness or tingling   She tried ibuprofen-no help  No ice or heat  Tried to stretch a little-no help   Patient Active Problem List   Diagnosis Date Noted  . Rash and nonspecific skin eruption 09/18/2018  . Visit for routine gyn exam 08/27/2018  . Tinea corporis 07/24/2018  . Smoking 05/13/2016  . Encounter for routine gynecological examination 05/12/2016  . Screen for STD (sexually transmitted disease) 05/12/2016  . Routine general medical examination at a health care facility 04/30/2016  . Headache(784.0) 07/31/2012  . Well adolescent visit 06/03/2012  . Low back pain 10/31/2011  . ALLERGIC RHINITIS 01/21/2008   History reviewed. No pertinent past medical history. Past Surgical History:  Procedure Laterality Date  . WISDOM TOOTH EXTRACTION     Social History   Tobacco Use  . Smoking status: Current Every Day Smoker    Packs/day: 0.75    Types: Cigarettes  . Smokeless tobacco: Never Used  Substance Use Topics  . Alcohol use: Yes    Alcohol/week: 0.0 standard drinks    Comment: occ  .  Drug use: No   History reviewed. No pertinent family history. No Known Allergies No current outpatient medications on file prior to visit.   No current facility-administered medications on file prior to visit.     Review of Systems  Constitutional: Negative for activity change, appetite change, fatigue, fever and unexpected weight change.  HENT: Negative for congestion, rhinorrhea, sore throat and trouble swallowing.   Eyes: Negative for pain, redness, itching and visual disturbance.  Respiratory: Negative for cough, chest tightness, shortness of breath and wheezing.   Cardiovascular: Negative for chest pain and palpitations.  Gastrointestinal: Negative for abdominal pain, blood in stool, constipation, diarrhea and nausea.  Endocrine: Negative for cold intolerance, heat intolerance, polydipsia and polyuria.  Genitourinary: Negative for difficulty urinating, dysuria, frequency and urgency.  Musculoskeletal: Positive for back pain. Negative for arthralgias, joint swelling and myalgias.  Skin: Positive for rash. Negative for pallor.  Neurological: Negative for dizziness, tremors, weakness, numbness and headaches.  Hematological: Negative for adenopathy. Does not bruise/bleed easily.  Psychiatric/Behavioral: Negative for decreased concentration and dysphoric mood. The patient is not nervous/anxious.        Objective:   Physical Exam  Constitutional: She appears well-developed and well-nourished. No distress.  Well appearing  HENT:  Head: Normocephalic and atraumatic.  Mouth/Throat: Oropharynx is clear and moist.  Eyes: Pupils are equal, round, and reactive to light. Conjunctivae and EOM are normal. Right eye exhibits no discharge. Left eye exhibits no discharge. No scleral icterus.  Neck: Normal range of motion. Neck supple.  Cardiovascular: Normal rate and regular rhythm.  Pulmonary/Chest: Effort normal and breath sounds normal. She has no wheezes. She has no rales.  Abdominal:  Soft. Bowel sounds are normal. She exhibits no distension. There is no tenderness.  Musculoskeletal: She exhibits tenderness.       Right shoulder: She exhibits decreased range of motion, tenderness, bony tenderness and spasm. She exhibits no swelling, no effusion, no crepitus, no deformity, normal pulse and normal strength.       Lumbar back: She exhibits decreased range of motion, tenderness and spasm. She exhibits no bony tenderness and no edema.  Tender over lower LS and coccyx Some bilat lumbar muscular tenderness-worse on R No piriformis tenderness Nl rom of hips SLR causes back and buttock pain  Some loss of lordosis  Nl gait   Flex 30 deg Ext full Lat bend-nl   No neuro changes   Lymphadenopathy:    She has no cervical adenopathy.  Neurological: She is alert. She has normal strength and normal reflexes. She displays no atrophy and normal reflexes. No cranial nerve deficit or sensory deficit. She exhibits normal muscle tone. Coordination normal.  Negative SLR  Skin: Skin is warm and dry. No rash noted. No erythema. No pallor.  Areas of scale with central clearing on arms (tinea) are improved   Several 1 cm or less papules with scale below elbows No rash on hands/web spaces/axillae   Mildly dry skin / fair complexion     Psychiatric: She has a normal mood and affect.          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  Diff also includes insect  /flea bites or eczema - triamcinolone cream px for local use Update if not starting to improve in a week or if worsening           Other   Low back pain - Primary    Acute on chronic  Rev notes from 2013 with film/PT  Now seeing chiropractor- problem is recurrent Loss of lordosis on exam   Recommend heat  Stretching-rehab handout given  Xray of LS today  Methocarbamol prn -caution of sedation  meloxicam (instead of otc nsaid) daily prn with food   Pend rad rev May consider PT again        Relevant Medications   methocarbamol (ROBAXIN) 500 MG tablet   meloxicam (MOBIC) 15 MG tablet   Other Relevant Orders   DG Lumbar Spine Complete

## 2018-09-18 NOTE — Assessment & Plan Note (Signed)
Acute on chronic  Rev notes from 2013 with film/PT  Now seeing chiropractor- problem is recurrent Loss of lordosis on exam   Recommend heat  Stretching-rehab handout given  Xray of LS today  Methocarbamol prn -caution of sedation  meloxicam (instead of otc nsaid) daily prn with food   Pend rad rev May consider PT again

## 2018-09-18 NOTE — Telephone Encounter (Signed)
Left VM requesting pt to call back regarding xray results

## 2018-09-18 NOTE — Assessment & Plan Note (Signed)
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

## 2018-09-23 ENCOUNTER — Telehealth: Payer: Self-pay | Admitting: Family Medicine

## 2018-09-23 DIAGNOSIS — G8929 Other chronic pain: Secondary | ICD-10-CM

## 2018-09-23 DIAGNOSIS — M545 Low back pain: Principal | ICD-10-CM

## 2018-09-23 NOTE — Telephone Encounter (Signed)
-----   Message from Shon MilletShapale M [place], [place] sent at 09/23/2018  4:26 PM EST ----- Pt notified of xray results and Dr. Royden Purlower's comments. Pt said her back is just a little better but not much improvement in the pain so she does agree with PT, please put referral in and I [facility] pt [facility]

## 2018-09-23 NOTE — Telephone Encounter (Signed)
Referral done Will route to [facility]  

## 2018-09-25 ENCOUNTER — Telehealth: Payer: Self-pay | Admitting: Family Medicine

## 2018-09-25 NOTE — Telephone Encounter (Signed)
Called patient and left her a message to call Shirlee LimerickMarion back to set up her PT, also sent her a My Chart message.Call [phone]

## 2018-09-25 NOTE — Telephone Encounter (Signed)
Called the patient and [facility] to call me back.

## 2018-10-08 ENCOUNTER — Telehealth: Payer: Self-pay | Admitting: Family Medicine

## 2018-10-08 NOTE — Telephone Encounter (Signed)
Left message asking pt to call office regarding referral °

## 2018-10-25 ENCOUNTER — Telehealth: Payer: Self-pay

## 2018-10-25 NOTE — Telephone Encounter (Signed)
Left message for patient to call back in regards to the referral, also left a message on patient's mother's phone

## 2019-06-02 IMAGING — CR DG SHOULDER 2+V*L*
3 series · 3 of 3 positions shown · non-contrast
Comparison: None.

CLINICAL DATA: Left shoulder pain for the past 2 months. No
reported injury.

EXAM:
LEFT SHOULDER - 2+ VIEW

[shoulder grashey]
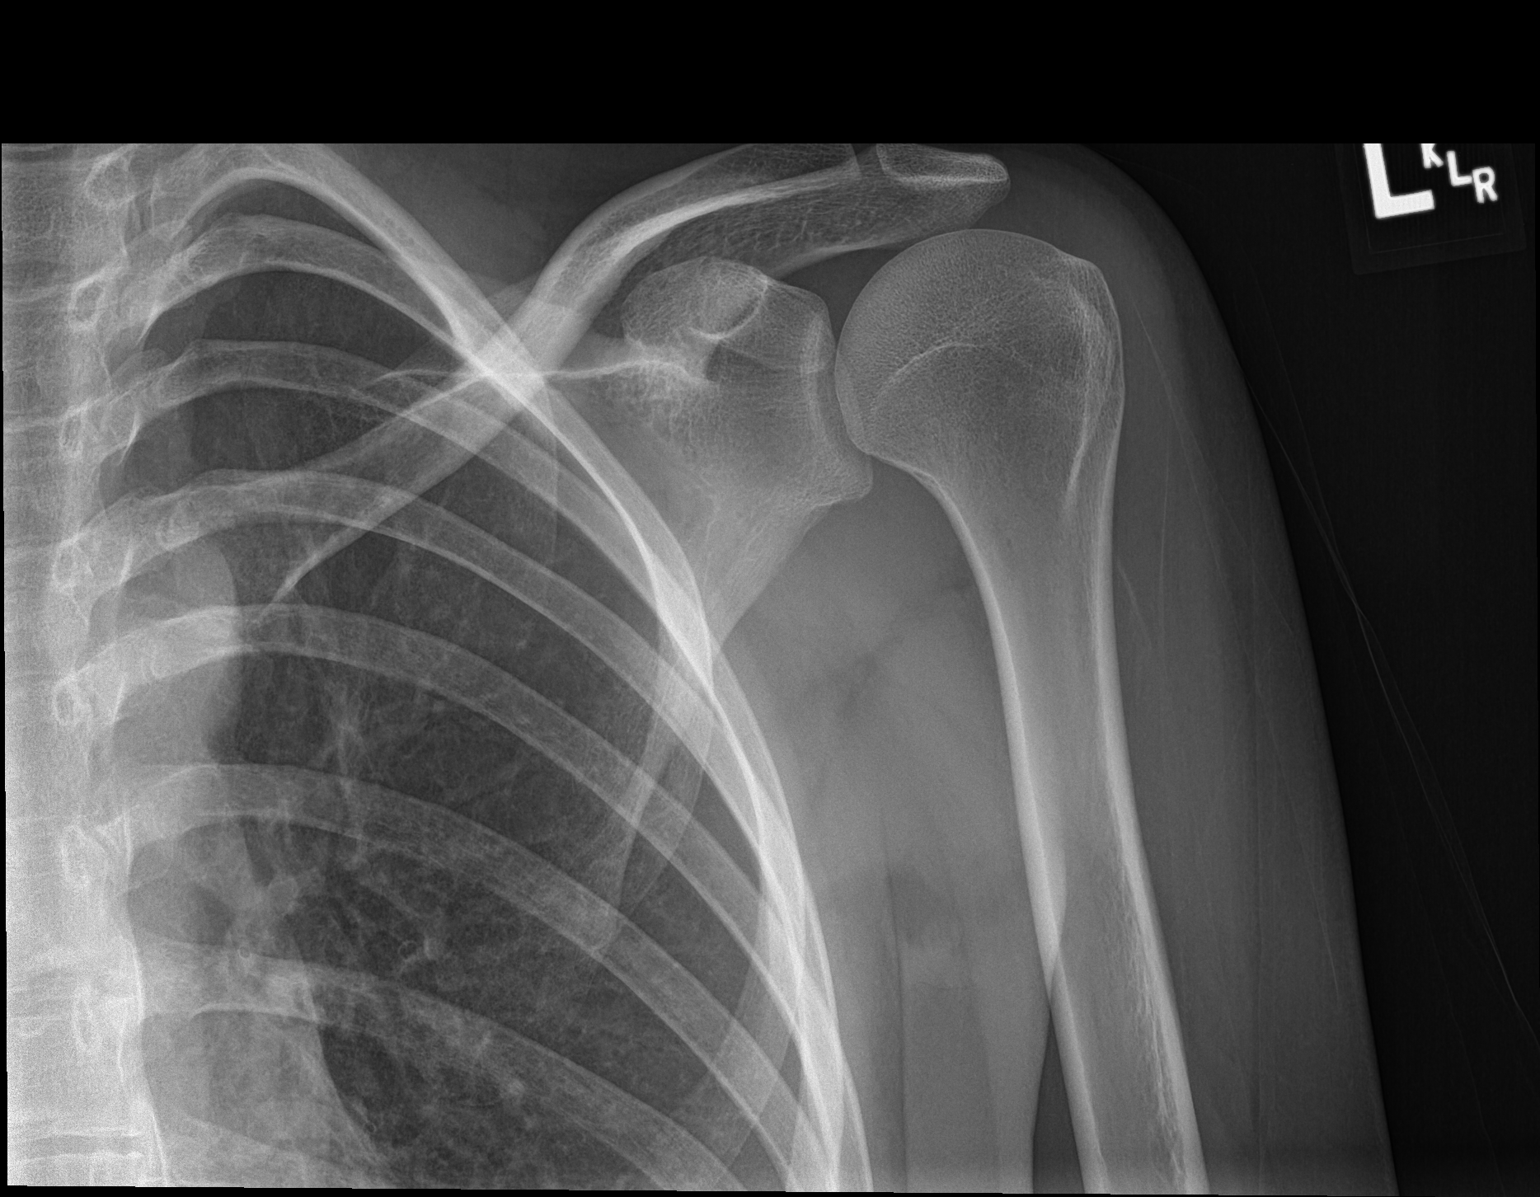

[shoulder y view]
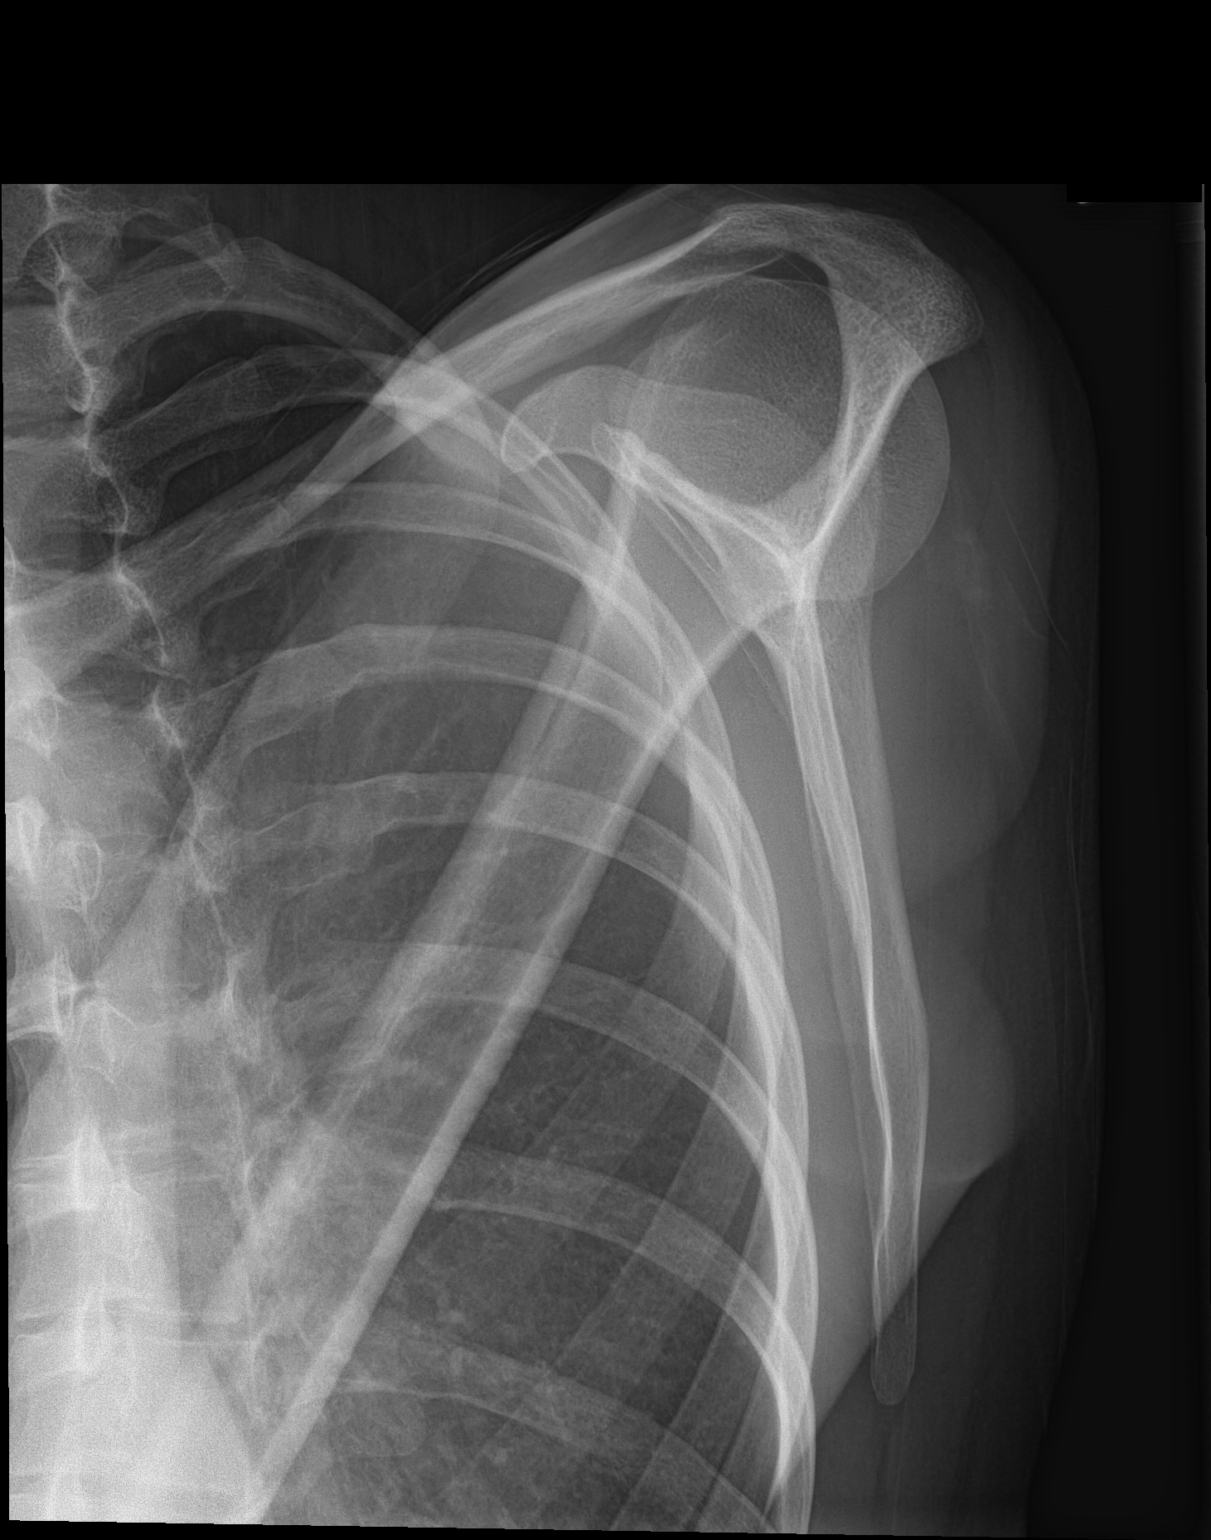

[shoulder axial]
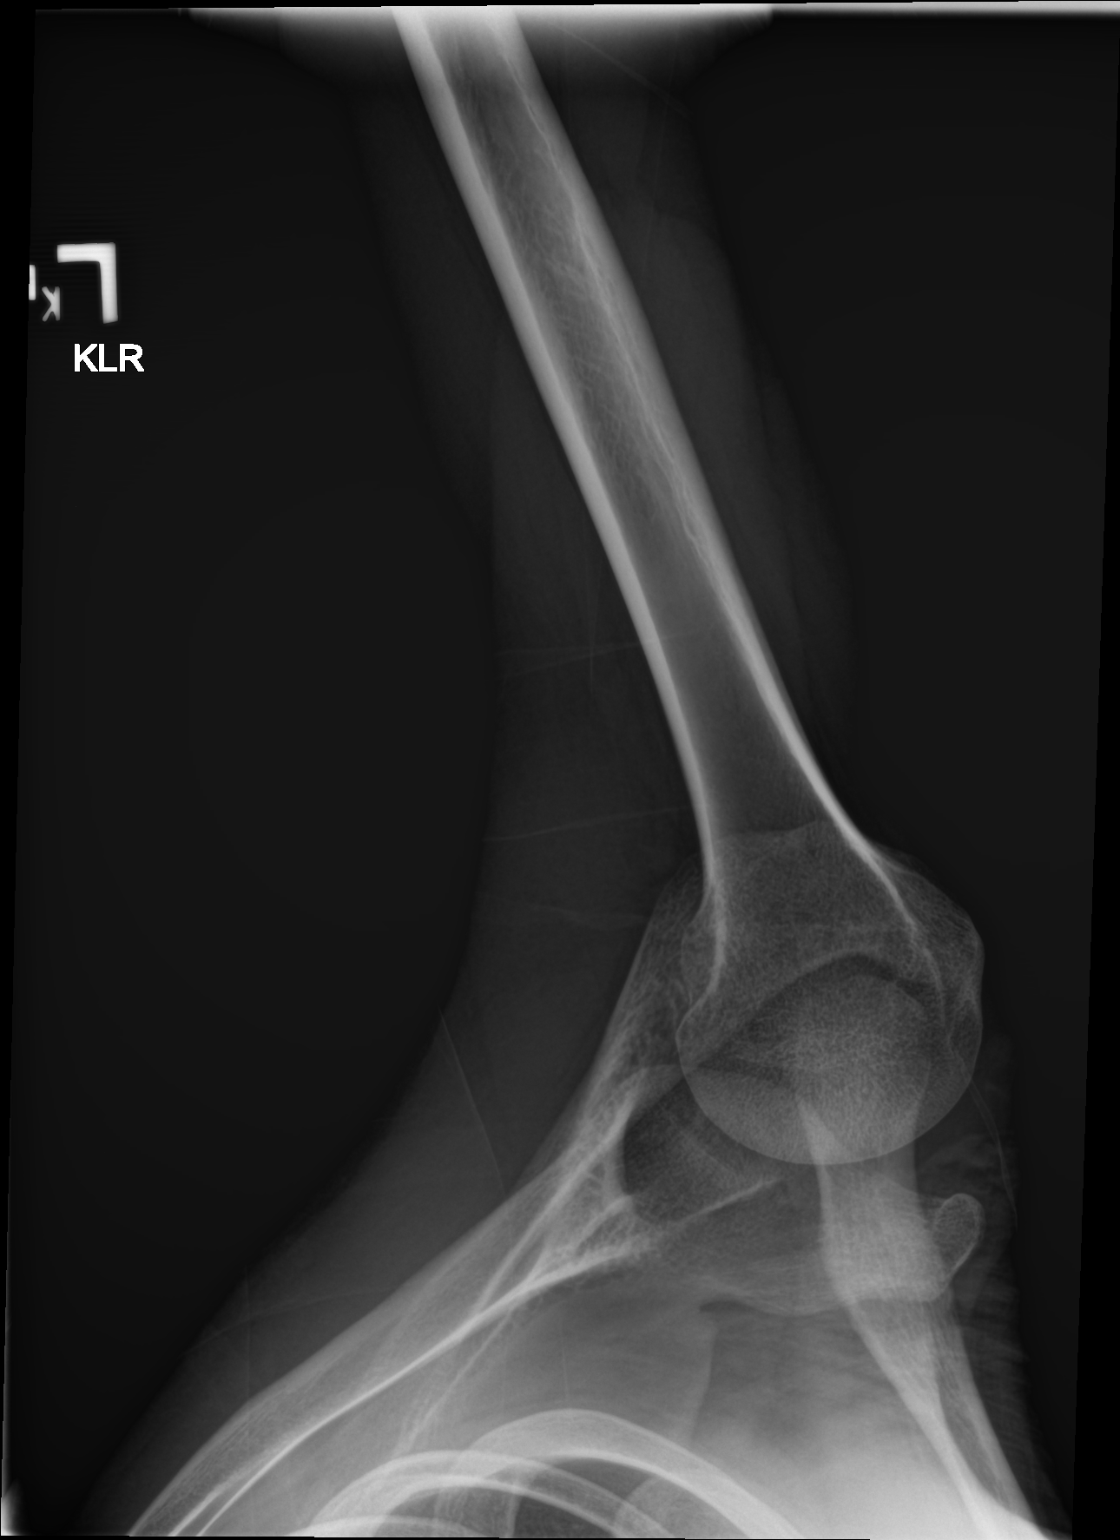

[3 of 3 positions shown; findings below may reference images not displayed]

FINDINGS: There is no evidence of fracture or dislocation. There is no
evidence of arthropathy or other focal bone abnormality. Soft
tissues are unremarkable.
IMPRESSION: Normal examination.

## 2019-08-06 ENCOUNTER — Ambulatory Visit (INDEPENDENT_AMBULATORY_CARE_PROVIDER_SITE_OTHER): Payer: BC Managed Care – PPO | Admitting: Family Medicine

## 2019-08-06 ENCOUNTER — Telehealth: Payer: BLUE CROSS/BLUE SHIELD | Admitting: Family

## 2019-08-06 ENCOUNTER — Other Ambulatory Visit: Payer: Self-pay

## 2019-08-06 ENCOUNTER — Encounter: Payer: Self-pay | Admitting: Family Medicine

## 2019-08-06 VITALS — BP 122/68 | HR 77 | Temp 98.2°F | Ht 65.5 in | Wt 146.1 lb

## 2019-08-06 DIAGNOSIS — Z23 Encounter for immunization: Secondary | ICD-10-CM | POA: Diagnosis not present

## 2019-08-06 DIAGNOSIS — R198 Other specified symptoms and signs involving the digestive system and abdomen: Secondary | ICD-10-CM

## 2019-08-06 DIAGNOSIS — R0989 Other specified symptoms and signs involving the circulatory and respiratory systems: Secondary | ICD-10-CM | POA: Insufficient documentation

## 2019-08-06 DIAGNOSIS — F17200 Nicotine dependence, unspecified, uncomplicated: Secondary | ICD-10-CM

## 2019-08-06 DIAGNOSIS — R300 Dysuria: Secondary | ICD-10-CM | POA: Diagnosis not present

## 2019-08-06 DIAGNOSIS — N3000 Acute cystitis without hematuria: Secondary | ICD-10-CM | POA: Diagnosis not present

## 2019-08-06 DIAGNOSIS — R399 Unspecified symptoms and signs involving the genitourinary system: Secondary | ICD-10-CM

## 2019-08-06 DIAGNOSIS — R109 Unspecified abdominal pain: Secondary | ICD-10-CM

## 2019-08-06 LAB — POC URINALSYSI DIPSTICK (AUTOMATED)
Bilirubin, UA: NEGATIVE
Blood, UA: 100
Glucose, UA: NEGATIVE
Ketones, UA: NEGATIVE
Nitrite, UA: POSITIVE
Protein, UA: POSITIVE — AB
Spec Grav, UA: 1.025 (ref 1.010–1.025)
Urobilinogen, UA: 0.2 E.U./dL
pH, UA: 6 (ref 5.0–8.0)

## 2019-08-06 MED ORDER — FAMOTIDINE 20 MG PO TABS: 20.0000 mg | ORAL_TABLET | Freq: Every day | ORAL | 1 refills | Status: DC

## 2019-08-06 MED ORDER — SULFAMETHOXAZOLE-TRIMETHOPRIM 800-160 MG PO TABS: 1.0000 | ORAL_TABLET | Freq: Two times a day (BID) | ORAL | 0 refills | Status: DC

## 2019-08-06 MED ORDER — FLUCONAZOLE 150 MG PO TABS: 150.0000 mg | ORAL_TABLET | Freq: Once | ORAL | 0 refills | Status: AC

## 2019-08-06 MED ORDER — PHENAZOPYRIDINE HCL 100 MG PO TABS: 100.0000 mg | ORAL_TABLET | Freq: Three times a day (TID) | ORAL | 0 refills | Status: DC | PRN

## 2019-08-06 NOTE — Assessment & Plan Note (Signed)
Acute cystitis with pos ua and voiding symptoms Bactrim DS and pyridium px  Enc water intake  cx pending inst to alert [place] if sudden worsening of symptoms or no imp

## 2019-08-06 NOTE — Progress Notes (Signed)
Subjective:    Patient ID: Nicole Mcdaniel, female    DOB: 11-Apr-1996, 23 y.o.   MRN: [id number]  HPI Pt presents with urinary c/o   Also feels like there is something in her throat - ? GERD   Pt had an e visit today and was told to see [place] in person since the had back pain with her urinary symptoms  Symptoms started last night Burns to urinate  Frequency of urination Bladder pressure  Strange odor to urine Very cloudy  No blood  Back hurts -low- alternates sides   Results for orders placed or performed in visit on 08/06/19  POCT Urinalysis Dipstick (Automated)  Result Value Ref Range   Color, UA Yellow    Clarity, UA Cloudy    Glucose, UA Negative Negative   Bilirubin, UA Negative    Ketones, UA Negative    Spec Grav, UA 1.025 1.010 - 1.025   Blood, UA 100 Ery/uL    pH, UA 6.0 5.0 - 8.0   Protein, UA Positive (A) Negative   Urobilinogen, UA 0.2 0.2 or 1.0 E.U./dL   Nitrite, UA Positive    Leukocytes, UA Moderate (2+) (A) Negative     LMP 10/8 - just finished  No chance pregnant  Condoms for contraception   Throat symptoms started a week ago  Some burning -like reflux of acid  Once in a while she gets heartburn  No nasal sprays  Now feels a lump in throat Swallowing is normal   No uri symtpoms at all    She took an at home kit last nt - pos leuk  Smoking status - trying to quit Patches and gum- day one   Patient Active Problem List   Diagnosis Date Noted  . Rash and nonspecific skin eruption 09/18/2018  . Visit for routine gyn exam 08/27/2018  . Tinea corporis 07/24/2018  . Smoking 05/13/2016  . Encounter for routine gynecological examination 05/12/2016  . Screen for STD (sexually transmitted disease) 05/12/2016  . Routine general medical examination at a health care facility 04/30/2016  . Headache(784.0) 07/31/2012  . Well adolescent visit 06/03/2012  . Low back pain 10/31/2011  . ALLERGIC RHINITIS 01/21/2008   History reviewed. No pertinent  past medical history. Past Surgical History:  Procedure Laterality Date  . WISDOM TOOTH EXTRACTION     Social History   Tobacco Use  . Smoking status: Current Every Day Smoker    Packs/day: 0.75    Types: Cigarettes  . Smokeless tobacco: Never Used  Substance Use Topics  . Alcohol use: Yes    Alcohol/week: 0.0 standard drinks    Comment: occ  . Drug use: No   History reviewed. No pertinent family history. No Known Allergies No current outpatient medications on file prior to visit.   No current facility-administered medications on file prior to visit.     Review of Systems  Constitutional: Negative for activity change, appetite change, fatigue, fever and unexpected weight change.  HENT: Negative for congestion, ear pain, rhinorrhea, sinus pressure and sore throat.        Globus sensation   Eyes: Negative for pain, redness, itching and visual disturbance.  Respiratory: Negative for cough, shortness of breath and wheezing.   Cardiovascular: Negative for chest pain, palpitations and leg swelling.  Gastrointestinal: Negative for abdominal distention, abdominal pain, blood in stool, constipation, diarrhea and nausea.       Heartburn episodes   Endocrine: Negative for cold intolerance, polydipsia and polyuria.  Genitourinary:  Positive for dysuria, frequency and urgency. Negative for difficulty urinating, flank pain and hematuria.  Musculoskeletal: Positive for back pain. Negative for arthralgias and myalgias.  Skin: Negative for pallor and rash.  Allergic/Immunologic: Negative for environmental allergies and immunocompromised state.  Neurological: Negative for dizziness, syncope, weakness and headaches.  Hematological: Negative for adenopathy. Does not bruise/bleed easily.  Psychiatric/Behavioral: Negative for decreased concentration and dysphoric mood. The patient is not nervous/anxious.        Objective:   Physical Exam Constitutional:      General: She is not in acute  distress.    Appearance: She is well-developed and normal weight. She is not ill-appearing or diaphoretic.  HENT:     Head: Normocephalic and atraumatic.     Right Ear: Tympanic membrane and ear canal normal.     Left Ear: Tympanic membrane and ear canal normal.     Nose: Nose normal.     Mouth/Throat:     Mouth: Mucous membranes are moist.     Pharynx: Oropharynx is clear. No oropharyngeal exudate or posterior oropharyngeal erythema.  Eyes:     General: No scleral icterus.       Right eye: No discharge.        Left eye: No discharge.     Conjunctiva/sclera: Conjunctivae normal.     Pupils: Pupils are equal, round, and reactive to light.  Neck:     Musculoskeletal: Normal range of motion and neck supple. No neck rigidity or muscular tenderness.     Vascular: No carotid bruit.  Cardiovascular:     Rate and Rhythm: Normal rate and regular rhythm.     Heart sounds: Normal heart sounds.  Pulmonary:     Effort: Pulmonary effort is normal.     Breath sounds: Normal breath sounds.  Abdominal:     General: Bowel sounds are normal. There is no distension.     Palpations: Abdomen is soft.     Tenderness: There is abdominal tenderness. There is no rebound.     Comments: No cva tenderness  Mild suprapubic tenderness  Lymphadenopathy:     Cervical: No cervical adenopathy.  Skin:    Coloration: Skin is not pale.     Findings: No rash.  Neurological:     Mental Status: She is alert.     Cranial Nerves: No cranial nerve deficit.  Psychiatric:        Mood and Affect: Mood normal.           Assessment & Plan:   Problem List Items Addressed This Visit      Genitourinary   Acute cystitis - Primary    Acute cystitis with pos ua and voiding symptoms Bactrim DS and pyridium px  Enc water intake  cx pending inst to alert [place] if sudden worsening of symptoms or no imp      Relevant Orders   Urine Culture     Other   Smoking    Disc in detail risks of smoking and possible  outcomes including copd, vascular/ heart disease, cancer , respiratory and sinus infections  Pt voices understanding Pt plans to quit Started using nicotine gum today and has patches Offered support       Globus sensation    Suspect from episode of acid reflux  Px for pepcid 20 mg daily given  inst to update after 2 wk if no imp (would consider ENT ref esp in light of smoking hx)  Enc to avoid foods that give her reflux  and also eating late        Other Visit Diagnoses    Dysuria       Relevant Orders   POCT Urinalysis Dipstick (Automated) (Completed)   Need for influenza vaccination       Relevant Orders   Flu Vaccine QUAD 6+ mos PF IM (Fluarix Quad PF) (Completed)

## 2019-08-06 NOTE — Assessment & Plan Note (Signed)
Disc in detail risks of smoking and possible outcomes including copd, vascular/ heart disease, cancer , respiratory and sinus infections  Pt voices understanding Pt plans to quit Started using nicotine gum today and has patches Offered support

## 2019-08-06 NOTE — Assessment & Plan Note (Signed)
Suspect from episode of acid reflux  Px for pepcid 20 mg daily given  inst to update after 2 wk if no imp (would consider ENT ref esp in light of smoking hx)  Enc to avoid foods that give her reflux and also eating late

## 2019-08-06 NOTE — Progress Notes (Signed)
Based on what you shared with me, I feel your condition warrants further evaluation and I recommend that you be seen for a face to face office visit.  Given current symptoms of a UTI with back pain you need to be seen face to face to rule out a more serious infection.   NOTE: If you entered your credit card information for this eVisit, you will not be charged. You may see a "hold" on your card for the $35 but that hold will drop off and you will not have a charge processed.  If you are having a true medical emergency please call 911.     For an urgent face to face visit, [facility] has four urgent care centers for your convenience:   . [facility]    [phone]                  Get Driving Directions  [address], [place] [address] . 10 am to 8 pm Monday-Friday . 12 pm to 8 pm Saturday-Sunday   . [facility] [place]                  Get Driving Directions  [address], [place], [place] [address] . 8 am to 8 pm Monday-Friday . 9 am to 6 pm Saturday . 11 am to 6 pm Sunday   . [facility] [place]                  Get Driving Directions   [address], [place] [address] . 8 am to 8 pm Monday-Friday . 8 am to 4 pm Saturday-Sunday    . [facility] [place]                    Get Driving Directions  [phone]  [address], [place], [place] [address]  . Monday-Friday, 12 PM to 6 PM    Your e-visit answers were reviewed by a board certified advanced clinical practitioner to complete your personal care plan.  Thank you for using e-Visits.

## 2019-08-06 NOTE — Patient Instructions (Addendum)
Drink lots of water  Take the bactrim as directed  Try pyridium as needed for urinary pain (take with food to prevent nausea)  We will call with culture result when it returns   Take diflucan if you get yeast   Try pepcid to help acid reflux/lump in your throat  If not better in 2 weeks please call and let us know

## 2019-08-08 ENCOUNTER — Telehealth: Payer: Self-pay

## 2019-08-08 LAB — URINE CULTURE
MICRO NUMBER:: 989162
SPECIMEN QUALITY:: ADEQUATE

## 2019-08-08 MED ORDER — NITROFURANTOIN MONOHYD MACRO 100 MG PO CAPS: 100.0000 mg | ORAL_CAPSULE | Freq: Two times a day (BID) | ORAL | 0 refills | Status: DC

## 2019-08-08 NOTE — Telephone Encounter (Signed)
Pt started bactrim DS one dose on 08/06/19, 2 doses on 08/07/19  And 1 dose so far today. Pt said she can see slight improvement in symptoms but not a lot. Pt still has urgency, frequency and burning upon urination. Pt has no fever, abd or back pain.pt is concerned that the weekend is here and wants to make sure this is normal for pt not to be a lot better at this point. I advised pt that sometimes it takes 2 -3 days of abx to see more improvement than what pt has seen. Pt understands and wants note sent to Dr Glori Bickers to verify she thinks it may take another day to see improvement. Pt request cb.

## 2019-08-08 NOTE — Telephone Encounter (Signed)
Culture just came back and uti is resistant to sulfa That is why  I sent macrobid to her pharmacy  Stop the bactrm and start this  Update [place] if no improvement

## 2019-08-08 NOTE — Telephone Encounter (Signed)
Phone when straight to VM so left VM letting pt know Dr. Marliss Coots comments and to start new abx

## 2019-08-11 NOTE — Telephone Encounter (Signed)
Called to check to see if pt got my message on Friday and no answer and pt's VM box was full so couldn't leave another VM

## 2019-08-12 NOTE — Telephone Encounter (Signed)
Since pt didn't respond to my message confirmed with pharmacy pt did pick up new med

## 2019-09-10 DIAGNOSIS — Z03818 Encounter for observation for suspected exposure to other biological agents ruled out: Secondary | ICD-10-CM | POA: Diagnosis not present

## 2019-09-11 DIAGNOSIS — Z20828 Contact with and (suspected) exposure to other viral communicable diseases: Secondary | ICD-10-CM | POA: Diagnosis not present

## 2019-09-30 DIAGNOSIS — K05322 Chronic periodontitis, generalized, moderate: Secondary | ICD-10-CM | POA: Diagnosis not present

## 2019-09-30 DIAGNOSIS — K036 Deposits [accretions] on teeth: Secondary | ICD-10-CM | POA: Diagnosis not present

## 2019-12-30 DIAGNOSIS — H5203 Hypermetropia, bilateral: Secondary | ICD-10-CM | POA: Diagnosis not present

## 2020-04-22 ENCOUNTER — Other Ambulatory Visit: Payer: Self-pay

## 2020-04-22 ENCOUNTER — Encounter: Payer: Self-pay | Admitting: Internal Medicine

## 2020-04-22 ENCOUNTER — Ambulatory Visit: Payer: BC Managed Care – PPO | Admitting: Internal Medicine

## 2020-04-22 VITALS — BP 114/72 | HR 71 | Temp 98.2°F | Wt 150.0 lb

## 2020-04-22 DIAGNOSIS — R350 Frequency of micturition: Secondary | ICD-10-CM | POA: Diagnosis not present

## 2020-04-22 DIAGNOSIS — R300 Dysuria: Secondary | ICD-10-CM

## 2020-04-22 DIAGNOSIS — R8290 Unspecified abnormal findings in urine: Secondary | ICD-10-CM

## 2020-04-22 LAB — POC URINALSYSI DIPSTICK (AUTOMATED)
Bilirubin, UA: NEGATIVE
Blood, UA: NEGATIVE
Glucose, UA: NEGATIVE
Ketones, UA: NEGATIVE
Leukocytes, UA: NEGATIVE
Nitrite, UA: NEGATIVE
Protein, UA: NEGATIVE
Spec Grav, UA: 1.015 (ref 1.010–1.025)
Urobilinogen, UA: 0.2 E.U./dL
pH, UA: 6.5 (ref 5.0–8.0)

## 2020-04-22 NOTE — Patient Instructions (Signed)

## 2020-04-22 NOTE — Progress Notes (Signed)
HPI  Pt presents to the clinic today with c/o urinary frequency, dysuria and cloudy urine. This started yesterday. She denies urgency, blood in her urine, vaginal discharge, odor, irritation, itching or abnormal bleeding. She denies fever, chills, nausea, low back pain or pelvic pain. She has not tried any medications OTC.   Review of Systems  No past medical history on file.  No family history on file.  Social History   Socioeconomic History  . Marital status: Single    Spouse name: Not on file  . Number of children: Not on file  . Years of education: Not on file  . Highest education level: Not on file  Occupational History  . Occupation: Holiday representative    Comment: [employer]  Tobacco Use  . Smoking status: Current Every Day Smoker    Packs/day: 0.75    Types: Cigarettes  . Smokeless tobacco: Never Used  Vaping Use  . Vaping Use: Never used  Substance and Sexual Activity  . Alcohol use: Yes    Alcohol/week: 0.0 standard drinks    Comment: occ  . Drug use: No  . Sexual activity: Not on file  Other Topics Concern  . Not on file  Social History Narrative  . Not on file   Social Determinants of Health   Financial Resource Strain:   . Difficulty of Paying Living Expenses:   Food Insecurity:   . Worried About Programme researcher, broadcasting/film/video in the Last Year:   . Barista in the Last Year:   Transportation Needs:   . Freight forwarder (Medical):   Marland Kitchen Lack of Transportation (Non-Medical):   Physical Activity:   . Days of Exercise per Week:   . Minutes of Exercise per Session:   Stress:   . Feeling of Stress :   Social Connections:   . Frequency of Communication with Friends and Family:   . Frequency of Social Gatherings with Friends and Family:   . Attends Religious Services:   . Active Member of Clubs or Organizations:   . Attends Banker Meetings:   Marland Kitchen Marital Status:   Intimate Partner Violence:   . Fear of Current or Ex-Partner:   . Emotionally Abused:   Marland Kitchen  Physically Abused:   . Sexually Abused:     No Known Allergies   Constitutional: Denies fever, malaise, fatigue, headache or abrupt weight changes.   GU: Pt reports cloudy urine, frequency and pain with urination. Denies urgency, burning sensation, blood in urine, odor or discharge. Skin: Denies redness, rashes, lesions or ulcercations.   No other specific complaints in a complete review of systems (except as listed in HPI above).    Objective:   Physical Exam  BP 114/72   Pulse 71   Temp 98.2 F (36.8 C) (Temporal)   Wt 150 lb (68 kg)   LMP 04/04/2020   SpO2 99%   BMI 24.58 kg/m   Wt Readings from Last 3 Encounters:  08/06/19 146 lb 1 oz (66.3 kg)  09/18/18 134 lb 12 oz (61.1 kg)  08/27/18 133 lb 12 oz (60.7 kg)    General: Appears her stated age, well developed, well nourished in NAD. Cardiovascular: Normal rate and rhythm. S1,S2 noted.   Pulmonary/Chest: Normal effort and positive vesicular breath sounds. No respiratory distress. No wheezes, rales or ronchi noted.  Abdomen: Soft and nontender. Normal bowel sounds. No distention or masses noted. No CVA tenderness.        Assessment & Plan:  Cloudy Urine, Frequency, Dysuria  Urinalysis: normal Will send urine culture OK to take AZO OTC Drink plenty of fluids   Will follow up after urine culture, RTC as needed or if symptoms persist. [name], NP This visit occurred during the SARS-CoV-2 public health emergency.  Safety protocols were in place, including screening questions prior to the visit, additional usage of staff PPE, and extensive cleaning of exam room while observing appropriate contact time as indicated for disinfecting solutions.

## 2020-04-22 NOTE — Addendum Note (Signed)
Addended by: Roena Malady on: 04/22/2020 09:57 AM   Modules accepted: Orders

## 2020-04-23 LAB — URINE CULTURE
MICRO NUMBER:: 10658060
Result:: NO GROWTH
SPECIMEN QUALITY:: ADEQUATE

## 2020-10-14 ENCOUNTER — Encounter: Payer: Self-pay | Admitting: Emergency Medicine

## 2020-10-14 ENCOUNTER — Other Ambulatory Visit: Payer: Self-pay

## 2020-10-14 ENCOUNTER — Ambulatory Visit
Admission: EM | Admit: 2020-10-14 | Discharge: 2020-10-14 | Disposition: A | Discharge status: Home / Self Care | Payer: BC Managed Care – PPO | Attending: Family Medicine | Admitting: Family Medicine

## 2020-10-14 DIAGNOSIS — B349 Viral infection, unspecified: Secondary | ICD-10-CM | POA: Insufficient documentation

## 2020-10-14 LAB — POCT RAPID STREP A (OFFICE): Rapid Strep A Screen: NEGATIVE

## 2020-10-14 NOTE — Discharge Instructions (Signed)
Your strep test was negative We are sending for culture  Covid test pending.  You can take OTC medicines as needed Follow up as needed for continued or worsening symptoms

## 2020-10-14 NOTE — ED Provider Notes (Signed)
Renaldo Fiddler    CSN: [id number] Arrival date & time: 10/14/20  1331      History   Chief Complaint Chief Complaint  Patient presents with  . Sore Throat    HPI Nicole Mcdaniel is a 24 y.o. female.   Patient is a 24 year old female presents today with sore throat, nasal congestion for 2 days.  The sore throat is mainly to the right side of her throat and radiating to the right ear.  She is also had mild headache off and on for the past couple days.  Denies any fevers.  Taken Tylenol and naproxen with some relief.  Positive strep exposure at work     History reviewed. No pertinent past medical history.  Patient Active Problem List   Diagnosis Date Noted  . Acute cystitis 08/06/2019  . Globus sensation 08/06/2019  . Visit for routine gyn exam 08/27/2018  . Tinea corporis 07/24/2018  . Smoking 05/13/2016  . Encounter for routine gynecological examination 05/12/2016  . Screen for STD (sexually transmitted disease) 05/12/2016  . Routine general medical examination at a health care facility 04/30/2016  . Headache(784.0) 07/31/2012  . Well adolescent visit 06/03/2012  . Low back pain 10/31/2011  . ALLERGIC RHINITIS 01/21/2008    Past Surgical History:  Procedure Laterality Date  . WISDOM TOOTH EXTRACTION      OB History   No obstetric history on file.      Home Medications    Prior to Admission medications   Medication Sig Start Date End Date Taking? Authorizing Provider  famotidine (PEPCID) 20 MG tablet Take 20 mg by mouth daily.    [provider]    Family History History reviewed. No pertinent family history.  Social History Social History   Tobacco Use  . Smoking status: Current Every Day Smoker    Packs/day: 0.75    Types: Cigarettes  . Smokeless tobacco: Never Used  Vaping Use  . Vaping Use: Never used  Substance Use Topics  . Alcohol use: Yes    Alcohol/week: 0.0 standard drinks    Comment: occ  . Drug use: No      Allergies   Patient has no known allergies.   Review of Systems Review of Systems   Physical Exam Triage Vital Signs ED Triage Vitals  Enc Vitals Group     BP 10/14/20 1345 129/88     Pulse Rate 10/14/20 1345 (!) 114     Resp 10/14/20 1345 17     Temp 10/14/20 1345 100 F (37.8 C)     Temp Source 10/14/20 1345 Oral     SpO2 10/14/20 1345 95 %     Weight 10/14/20 1343 150 lb (68 kg)     Height 10/14/20 1343 5\' 5"  (1.651 m)     Head Circumference --      Peak Flow --      Pain Score 10/14/20 1343 3     Pain Loc --      Pain Edu? --      Excl. in GC? --    No data found.  Updated Vital Signs BP 129/88 (BP Location: Right Arm)   Pulse (!) 114   Temp 100 F (37.8 C) (Oral)   Resp 17   Ht 5\' 5"  (1.651 m)   Wt 150 lb (68 kg)   LMP 10/07/2020   SpO2 95%   BMI 24.96 kg/m   Visual Acuity Right Eye Distance:   Left Eye Distance:  Bilateral Distance:    Right Eye Near:   Left Eye Near:    Bilateral Near:     Physical Exam Vitals and nursing note reviewed.  Constitutional:      General: She is not in acute distress.    Appearance: Normal appearance. She is not ill-appearing, toxic-appearing or diaphoretic.  HENT:     Head: Normocephalic.     Right Ear: Tympanic membrane and ear canal normal.     Left Ear: Tympanic membrane and ear canal normal.     Nose: Nose normal.     Mouth/Throat:     Pharynx: Oropharynx is clear. Posterior oropharyngeal erythema present.     Tonsils: 0 on the right. 0 on the left.  Eyes:     Conjunctiva/sclera: Conjunctivae normal.  Cardiovascular:     Rate and Rhythm: Normal rate and regular rhythm.  Pulmonary:     Effort: Pulmonary effort is normal.     Breath sounds: Normal breath sounds.  Musculoskeletal:        General: Normal range of motion.     Cervical back: Normal range of motion.  Skin:    General: Skin is warm and dry.     Findings: No rash.  Neurological:     Mental Status: She is alert.  Psychiatric:         Mood and Affect: Mood normal.      UC Treatments / Results  Labs (all labs ordered are listed, but only abnormal results are displayed) Labs Reviewed  NOVEL CORONAVIRUS, NAA  CULTURE, GROUP A STREP [facility])  POCT RAPID STREP A (OFFICE)    EKG   Radiology No results found.  Procedures Procedures (including critical care time)  Medications Ordered in UC Medications - No data to display  Initial Impression / Assessment and Plan / UC Course  I have reviewed the triage vital signs and the nursing notes.  Pertinent labs & imaging results that were available during my care of the patient were reviewed by me and considered in my medical decision making (see chart for details).     Viral illness Patient test negative.  Sending for culture.  Covid test pending. Recommend over-the-counter medicines as needed for symptoms. Follow up as needed for continued or worsening symptoms  Final Clinical Impressions(s) / UC Diagnoses   Final diagnoses:  Viral illness     Discharge Instructions     Your strep test was negative We are sending for culture  Covid test pending.  You can take OTC medicines as needed Follow up as needed for continued or worsening symptoms     ED Prescriptions    None     PDMP not reviewed this encounter.   Janace Aris, NP 10/14/20 1413

## 2020-10-14 NOTE — ED Triage Notes (Signed)
Patient c/o sore throat and nasal congestion x 2 days.   Patient endorses RT sided throat pain. Patient endorses "off and on" headache for 3 days.   Patient endorses RT sided ear pain.  Patient denies fever at home.   Patient has taken Tylenol and Naproxen w/ some relief of symptoms.

## 2020-10-16 LAB — NOVEL CORONAVIRUS, NAA: SARS-CoV-2, NAA: NOT DETECTED

## 2020-10-16 LAB — SARS-COV-2, NAA 2 DAY TAT

## 2020-10-17 LAB — CULTURE, GROUP A STREP (THRC)

## 2020-12-02 DIAGNOSIS — H16001 Unspecified corneal ulcer, right eye: Secondary | ICD-10-CM | POA: Diagnosis not present

## 2020-12-03 DIAGNOSIS — H16001 Unspecified corneal ulcer, right eye: Secondary | ICD-10-CM | POA: Diagnosis not present

## 2021-03-01 DIAGNOSIS — H5203 Hypermetropia, bilateral: Secondary | ICD-10-CM | POA: Diagnosis not present

## 2021-07-20 ENCOUNTER — Other Ambulatory Visit: Payer: Self-pay

## 2021-07-20 ENCOUNTER — Encounter: Payer: Self-pay | Admitting: Family Medicine

## 2021-07-20 ENCOUNTER — Telehealth (INDEPENDENT_AMBULATORY_CARE_PROVIDER_SITE_OTHER): Payer: BC Managed Care – PPO | Admitting: Family Medicine

## 2021-07-20 VITALS — Ht 65.0 in | Wt 146.0 lb

## 2021-07-20 DIAGNOSIS — J069 Acute upper respiratory infection, unspecified: Secondary | ICD-10-CM

## 2021-07-20 DIAGNOSIS — F17200 Nicotine dependence, unspecified, uncomplicated: Secondary | ICD-10-CM

## 2021-07-20 NOTE — Assessment & Plan Note (Signed)
Mild and uncomplicated Neg rapid covid test at home and plans to get pcr test at pharmacy and update [place]  Symptom control discussed Fluids/rest Delsym prn  Watch for fever or other new symptoms  ER precautions discussed  Enc to isolate until covid result

## 2021-07-20 NOTE — Progress Notes (Signed)
Virtual Visit via Video Note  I connected with Nicole Mcdaniel on 07/20/21 at 10:00 AM EDT by a video enabled telemedicine application and verified that I am speaking with the correct person using two identifiers.  Location: Patient: her car Provider: office   I discussed the limitations of evaluation and management by telemedicine and the availability of in person appointments. The patient expressed understanding and agreed to proceed.  Parties involved in encounter  Patient: Nicole Mcdaniel  Provider:  Roxy Manns MD   History of Present Illness: Pt presents with cough and sore throat  She is a smoker    Started with a scratchy throat on Monday  Yesterday some cough also  Was up all night coughing  Not a lot of pnd  A little runny nose /congestion  Not productive No wheezing Not sob   No fever or chills or aches  No n/v/d  No skin rash   A person at work had covid 2 weeks ago -she tested neg at the time No known exposures   At home covid test last night neg   Worked mon/tues At home today    Otc:  Sudafed last night   Covid status Not immunized for covid  Not imm for flu   She is at her house in [place]  Would like to get a test closer to home  Patient Active Problem List   Diagnosis Date Noted   Acute cystitis 08/06/2019   Globus sensation 08/06/2019   Visit for routine gyn exam 08/27/2018   Tinea corporis 07/24/2018   Smoking 05/13/2016   Encounter for routine gynecological examination 05/12/2016   Screen for STD (sexually transmitted disease) 05/12/2016   Routine general medical examination at a health care facility 04/30/2016   Headache(784.0) 07/31/2012   Well adolescent visit 06/03/2012   Low back pain 10/31/2011   ALLERGIC RHINITIS 01/21/2008   History reviewed. No pertinent past medical history. Past Surgical History:  Procedure Laterality Date   WISDOM TOOTH EXTRACTION     Social History   Tobacco Use   Smoking status: Every Day     Packs/day: 0.75    Types: Cigarettes   Smokeless tobacco: Never  Vaping Use   Vaping Use: Never used  Substance Use Topics   Alcohol use: Yes    Alcohol/week: 0.0 standard drinks    Comment: occ   Drug use: No   History reviewed. No pertinent family history. No Known Allergies Current Outpatient Medications on File Prior to Visit  Medication Sig Dispense Refill   famotidine (PEPCID) 20 MG tablet Take 20 mg by mouth daily.     No current facility-administered medications on file prior to visit.   Review of Systems  Constitutional:  Negative for chills, fever and malaise/fatigue.  HENT:  Positive for congestion. Negative for ear pain, sinus pain and sore throat.        Scratch throat  Eyes:  Negative for blurred vision, discharge and redness.  Respiratory:  Positive for cough. Negative for sputum production, shortness of breath, wheezing and stridor.   Cardiovascular:  Negative for chest pain, palpitations and leg swelling.  Gastrointestinal:  Negative for abdominal pain, diarrhea, nausea and vomiting.  Musculoskeletal:  Negative for myalgias.  Skin:  Negative for rash.  Neurological:  Negative for dizziness and headaches.   Observations/Objective: Patient appears well, in no distress Weight is baseline  No facial swelling or asymmetry Normal voice-not hoarse and no slurred speech No obvious tremor or mobility impairment Moving neck  and UEs normally Able to hear the call well  A few dry coughs noted  Talkative and mentally sharp with no cognitive changes No skin changes on face or neck , no rash or pallor Affect is normal    Assessment and Plan: Problem List Items Addressed This Visit       Respiratory   Viral URI with cough    Mild and uncomplicated Neg rapid covid test at home and plans to get pcr test at pharmacy and update [place]  Symptom control discussed Fluids/rest Delsym prn  Watch for fever or other new symptoms  ER precautions discussed  Enc to isolate  until covid result         Other   Smoking - Primary    Encourage smoking cessation         Follow Up Instructions: Drink fluids and rest   Try an antihistamine like zyrtec 10 mg daily as needed for runny nose  Delsym as directed for dry cough   Tylenol for fever or pain   Please see if you can get a PCR covid test close to you If not we can do one here  The office will call you in a little while to see if you had success Stay out of work until negative covid test and you feel better  If any severe symptoms or shortness of breath let us know and go to the ER   I discussed the assessment and treatment plan with the patient. The patient was provided an opportunity to ask questions and all were answered. The patient agreed with the plan and demonstrated an understanding of the instructions.   The patient was advised to call back or seek an in-person evaluation if the symptoms worsen or if the condition fails to improve as anticipated.     Roxy Manns, MD

## 2021-07-20 NOTE — Assessment & Plan Note (Signed)
Encourage smoking cessation

## 2021-07-20 NOTE — Patient Instructions (Signed)
Drink fluids and rest   Try an antihistamine like zyrtec 10 mg daily as needed for runny nose  Delsym as directed for dry cough   Tylenol for fever or pain   Please see if you can get a PCR covid test close to you If not we can do one here  The office will call you in a little while to see if you had success Stay out of work until negative covid test and you feel better  If any severe symptoms or shortness of breath let us know and go to the ER

## 2021-08-01 ENCOUNTER — Other Ambulatory Visit: Payer: Self-pay

## 2021-08-01 ENCOUNTER — Ambulatory Visit (LOCAL_COMMUNITY_HEALTH_CENTER): Payer: Self-pay

## 2021-08-01 DIAGNOSIS — Z23 Encounter for immunization: Secondary | ICD-10-CM

## 2021-08-01 NOTE — Progress Notes (Signed)
In Nurse Clinic for Rabies vaccine. Works at [employer]. Has never received rabies vaccine. Rabavert administered today and tolerated well. Updated [facility] copy given. Has appt for Rabavert #2 08/08/2021, pt aware. Jerel Shepherd, RN

## 2021-08-08 ENCOUNTER — Ambulatory Visit (LOCAL_COMMUNITY_HEALTH_CENTER): Payer: Self-pay

## 2021-08-08 ENCOUNTER — Other Ambulatory Visit: Payer: Self-pay

## 2021-08-08 DIAGNOSIS — Z23 Encounter for immunization: Secondary | ICD-10-CM

## 2021-08-08 DIAGNOSIS — Z719 Counseling, unspecified: Secondary | ICD-10-CM

## 2021-08-08 NOTE — Progress Notes (Signed)
Rabies vaccine # 2 administered today and tolerated well. [facility] system down today.  Paper copy of administered vaccine given to patient.  [facility] copy will be mailed once available. Hart Carwin, RN

## 2022-02-21 ENCOUNTER — Encounter: Payer: Self-pay | Admitting: Family Medicine

## 2022-02-21 ENCOUNTER — Ambulatory Visit (INDEPENDENT_AMBULATORY_CARE_PROVIDER_SITE_OTHER): Payer: 59 | Admitting: Family Medicine

## 2022-02-21 VITALS — BP 116/78 | HR 81 | Temp 98.0°F | Ht 65.5 in | Wt 152.5 lb

## 2022-02-21 DIAGNOSIS — F17200 Nicotine dependence, unspecified, uncomplicated: Secondary | ICD-10-CM

## 2022-02-21 DIAGNOSIS — Z808 Family history of malignant neoplasm of other organs or systems: Secondary | ICD-10-CM

## 2022-02-21 DIAGNOSIS — Z0000 Encounter for general adult medical examination without abnormal findings: Secondary | ICD-10-CM

## 2022-02-21 DIAGNOSIS — IMO0001 Reserved for inherently not codable concepts without codable children: Secondary | ICD-10-CM

## 2022-02-21 NOTE — Assessment & Plan Note (Signed)
Pt wants to go to [facility] where her father goes (he recently had melanoma) ?Counseled on sun protection  ? ?

## 2022-02-21 NOTE — Assessment & Plan Note (Signed)
Reviewed health habits including diet and exercise and skin cancer prevention ?Reviewed appropriate screening tests for age  ?Also reviewed health mt list, fam hx and immunization status , as well as social and family history   ?See HPI ?Labs reviewed from 2019, may do at 4 year  ?Discussed smoking cessation  ?May consider OC once she has quit /using condoms for contraception now  ?Monogamous- (engaged) declines STD screening  ?Pap is utd with neg HPV in 2019  ?Recommend regular exercise  ?Has had rabies shots for work ?Ref to derm for skin screen (fam h/o melanoma) and discussed sun protection  ?

## 2022-02-21 NOTE — Patient Instructions (Addendum)
Think about patches for smoking cessation  ?You can get those over the counter (also inhaler or gum)  ? ?Think about adding exercise when you can ?Use sun protection  ? ?I will place a referral for dermatology  ?Go ahead and call for an appointment  ? ? ? ? ? ? ? ? ?

## 2022-02-21 NOTE — Progress Notes (Signed)
? ?Subjective:  ? ? Patient ID: Nicole NakaiHannah E Mcdaniel, female    DOB: 09/14/1996, 26 y.o.   MRN: [id number] ? ?HPI ?Here for health maintenance exam and to review chronic medical problems   ? ?Wt Readings from Last 3 Encounters:  ?02/21/22 152 lb 8 oz (69.2 kg)  ?07/20/21 146 lb (66.2 kg)  ?10/14/20 150 lb (68 kg)  ? ?24.99 kg/m? ? ?Doing ok  ?Got covid after trip to [place] (not severe)  ? 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 ? ?Immunization History  ?Administered Date(s) Administered  ? HPV Quadrivalent 07/05/2012, 09/06/2012, 01/03/2013  ? Influenza,inj,Quad PF,6+ Mos 09/24/2015, 08/27/2018, 08/06/2019  ? Rabies, IM 08/01/2021, 08/08/2021  ? Tdap 06/03/2012, 02/05/2017  ?Did not get a flu shot this  ?No covid shots  ? ? ? ?Pap 08/2018  negative with neg HPV ?LMP 4/13 ?Regular  ? ?Gets brain fog before menses  ? ?OC last caused hair loss and weight gain  ? ? ?Last 5-7 days  ?The first few days are heavy  ?Uses condoms  ? ?Self breast exam: no lumps  ? ?STD screen: declines  ? ?Smoking status : still smokes  ?1/2 to 1ppd  ? ?Alcohol - 2 drinks per day maximum/pairs it with smoking  ? ?Eats fairly/not always healthy  ?Busy physical job/ no extra exercise  ?Gets enough sleep  ? ? ?Really wants to quit  ?Hardest part - boyfriend smokes at home  ? ?If she is home/not stressed can go all day without smoking  ? ?She has not used patches  ? ?Patient Active Problem List  ? Diagnosis Date Noted  ? Family history of malignant melanoma of skin 02/21/2022  ? Visit for routine gyn exam 08/27/2018  ? Smoking 05/13/2016  ? Encounter for routine gynecological examination 05/12/2016  ? Screen for STD (sexually transmitted disease) 05/12/2016  ? Routine general medical examination at a health care facility 04/30/2016  ? ALLERGIC RHINITIS 01/21/2008  ? ?History reviewed. No pertinent past medical history. ?Past Surgical History:  ?Procedure Laterality Date  ? WISDOM TOOTH EXTRACTION    ? ?Social History   ? ?Tobacco Use  ? Smoking status: Every Day  ?  Packs/day: 0.75  ?  Types: Cigarettes  ? Smokeless tobacco: Never  ?Vaping Use  ? Vaping Use: Never used  ?Substance Use Topics  ? Alcohol use: Yes  ?  Alcohol/week: 0.0 standard drinks  ?  Comment: occ  ? Drug use: No  ? ?History reviewed. No pertinent family history. ?No Known Allergies ?Current Outpatient Medications on File Prior to Visit  ?Medication Sig Dispense Refill  ? cetirizine (ZYRTEC) 10 MG tablet Take 10 mg by mouth daily as needed for allergies.    ? famotidine (PEPCID) 20 MG tablet Take 20 mg by mouth daily as needed.    ? ?No current facility-administered medications on file prior to visit.  ?  ?Review of Systems  ?Constitutional:  Negative for activity change, appetite change, fatigue, fever and unexpected weight change.  ?HENT:  Negative for congestion, ear pain, rhinorrhea, sinus pressure and sore throat.   ?Eyes:  Negative for pain, redness and visual disturbance.  ?Respiratory:  Negative for cough, shortness of breath and wheezing.   ?Cardiovascular:  Negative for chest pain and palpitations.  ?Gastrointestinal:  Negative for abdominal pain, blood in stool, constipation and diarrhea.  ?Endocrine: Negative for polydipsia and polyuria.  ?Genitourinary:  Negative for dysuria, frequency and urgency.  ?Musculoskeletal:  Negative for arthralgias, back pain and myalgias.  ?Skin:  Negative for pallor and rash.  ?Allergic/Immunologic: Negative for environmental allergies.  ?Neurological:  Negative for dizziness, syncope and headaches.  ?Hematological:  Negative for adenopathy. Does not bruise/bleed easily.  ?Psychiatric/Behavioral:  Negative for decreased concentration and dysphoric mood. The patient is not nervous/anxious.   ? ?   ?Objective:  ? Physical Exam ?Constitutional:   ?   General: She is not in acute distress. ?   Appearance: Normal appearance. She is well-developed and normal weight. She is not ill-appearing or diaphoretic.  ?HENT:  ?   Head:  Normocephalic and atraumatic.  ?   Right Ear: Tympanic membrane, ear canal and external ear normal.  ?   Left Ear: Tympanic membrane, ear canal and external ear normal.  ?   Nose: Nose normal. No congestion.  ?   Mouth/Throat:  ?   Mouth: Mucous membranes are moist.  ?   Pharynx: Oropharynx is clear. No posterior oropharyngeal erythema.  ?Eyes:  ?   General: No scleral icterus. ?   Extraocular Movements: Extraocular movements intact.  ?   Conjunctiva/sclera: Conjunctivae normal.  ?   Pupils: Pupils are equal, round, and reactive to light.  ?Neck:  ?   Thyroid: No thyromegaly.  ?   Vascular: No carotid bruit or JVD.  ?Cardiovascular:  ?   Rate and Rhythm: Normal rate and regular rhythm.  ?   Pulses: Normal pulses.  ?   Heart sounds: Normal heart sounds.  ?  No gallop.  ?Pulmonary:  ?   Effort: Pulmonary effort is normal. No respiratory distress.  ?   Breath sounds: Normal breath sounds. No wheezing.  ?   Comments: Good air exch ?Chest:  ?   Chest wall: No tenderness.  ?Abdominal:  ?   General: Bowel sounds are normal. There is no distension or abdominal bruit.  ?   Palpations: Abdomen is soft. There is no mass.  ?   Tenderness: There is no abdominal tenderness.  ?   Hernia: No hernia is present.  ?Genitourinary: ?   Comments: Breast exam: No mass, nodules, thickening, tenderness, bulging, retraction, inflamation, nipple discharge or skin changes noted.  No axillary or clavicular LA.     ?Musculoskeletal:     ?   General: No tenderness. Normal range of motion.  ?   Cervical back: Normal range of motion and neck supple. No rigidity. No muscular tenderness.  ?   Right lower leg: No edema.  ?   Left lower leg: No edema.  ?   Comments: No kyphosis  ?No acute joint changes   ?Lymphadenopathy:  ?   Cervical: No cervical adenopathy.  ?Skin: ?   General: Skin is warm and dry.  ?   Coloration: Skin is not pale.  ?   Findings: No erythema or rash.  ?Neurological:  ?   Mental Status: She is alert. Mental status is at baseline.   ?   Cranial Nerves: No cranial nerve deficit.  ?   Motor: No abnormal muscle tone.  ?   Coordination: Coordination normal.  ?   Gait: Gait normal.  ?   Deep Tendon Reflexes: Reflexes are normal and symmetric. Reflexes normal.  ?Psychiatric:     ?   Mood and Affect: Mood normal.     ?   Cognition and Memory: Cognition and memory normal.  ?   Comments: Pleasant  ?Good mood  ? ?Here with fiancee today -pleasant and supportive   ? ? ? ? ? ?   ?  Assessment & Plan:  ? ?Problem List Items Addressed This Visit   ? ?  ? Other  ? Family history of malignant melanoma of skin  ?  Pt wants to go to [facility] where her father goes (he recently had melanoma) ?Counseled on sun protection  ? ? ?  ?  ? Relevant Orders  ? Ambulatory referral to Dermatology  ? Routine general medical examination at a health care facility - Primary  ?  Reviewed health habits including diet and exercise and skin cancer prevention ?Reviewed appropriate screening tests for age  ?Also reviewed health mt list, fam hx and immunization status , as well as social and family history   ?See HPI ?Labs reviewed from 2019, may do at 4 year  ?Discussed smoking cessation  ?May consider OC once she has quit /using condoms for contraception now  ?Monogamous- (engaged) declines STD screening  ?Pap is utd with neg HPV in 2019  ?Recommend regular exercise  ?Has had rabies shots for work ?Ref to derm for skin screen (fam h/o melanoma) and discussed sun protection  ? ?  ?  ? Smoking  ?  Disc in detail risks of smoking and possible outcomes including copd, vascular/ heart disease, cancer , respiratory and sinus infections  ?Pt voices understanding ?Interested in quitting ?1/2-1ppd  ?Triggers are other people smoking and stress  ?Lives with smoker/plan to try and quit together  ?Disc options, will likely try nicotine replacement  ?Handout given  ?Can try quit line  ? ?  ?  ? ? ? ?

## 2022-02-21 NOTE — Assessment & Plan Note (Signed)
Disc in detail risks of smoking and possible outcomes including copd, vascular/ heart disease, cancer , respiratory and sinus infections  ?Pt voices understanding ?Interested in quitting ?1/2-1ppd  ?Triggers are other people smoking and stress  ?Lives with smoker/plan to try and quit together  ?Disc options, will likely try nicotine replacement  ?Handout given  ?Can try quit line  ? ?

## 2022-03-09 ENCOUNTER — Encounter: Payer: Self-pay | Admitting: *Deleted

## 2022-07-26 ENCOUNTER — Encounter: Payer: Self-pay | Admitting: Family Medicine

## 2022-07-26 ENCOUNTER — Ambulatory Visit (INDEPENDENT_AMBULATORY_CARE_PROVIDER_SITE_OTHER): Payer: 59 | Admitting: Family Medicine

## 2022-07-26 DIAGNOSIS — J0110 Acute frontal sinusitis, unspecified: Secondary | ICD-10-CM

## 2022-07-26 DIAGNOSIS — J019 Acute sinusitis, unspecified: Secondary | ICD-10-CM | POA: Insufficient documentation

## 2022-07-26 MED ORDER — AMOXICILLIN-POT CLAVULANATE 875-125 MG PO TABS: 1.0000 | ORAL_TABLET | Freq: Two times a day (BID) | ORAL | 0 refills | Status: DC

## 2022-07-26 NOTE — Patient Instructions (Signed)
Take the augmentin with food as directed  Drink lots of fluids Avoid excessive caffeine   Try mucinex DM for cough and congestion  For nasal congestion - if needed/ saline spray is good   If you are not improving we may want to try a course of prednisone   Update if not starting to improve in a week or if worsening    Keep thinking about quitting smoking

## 2022-07-26 NOTE — Progress Notes (Signed)
Subjective:    Patient ID: Nicole Mcdaniel, female    DOB: 06/06/1996, 26 y.o.   MRN: [id number]  HPI Pt present with c/o headache   Wt Readings from Last 3 Encounters:  07/26/22 151 lb 4 oz (68.6 kg)  02/21/22 152 lb 8 oz (69.2 kg)  07/20/21 146 lb (66.2 kg)   24.79 kg/m  Having problems with a cough  Head cold symptoms for a few days  Since sept 24 - covid testing negative  Since then productive cough (white or yellow)  No wheeze  No sob  No fever   Headache - started with pain in back of her head  Worse when she coughs  Dull pain  Not top and sides -worse on R side  No sinus pain  Does not feel congested    Smoking status : 1/2 to 3/4 ppd    Otc :  Alka selzer cold and cough for 3 days  Day quil now as needed   No nsaids   Only drinks water these days  Limits energy drinks    BP Readings from Last 3 Encounters:  07/26/22 124/76  02/21/22 116/78  10/14/20 129/88   Pulse Readings from Last 3 Encounters:  07/26/22 71  02/21/22 81  10/14/20 (!) 114   Patient Active Problem List   Diagnosis Date Noted   Acute sinusitis 07/26/2022   Family history of malignant melanoma of skin 02/21/2022   Visit for routine gyn exam 08/27/2018   Smoking 05/13/2016   Encounter for routine gynecological examination 05/12/2016   Screen for STD (sexually transmitted disease) 05/12/2016   Routine general medical examination at a health care facility 04/30/2016   ALLERGIC RHINITIS 01/21/2008   History reviewed. No pertinent past medical history. Past Surgical History:  Procedure Laterality Date   WISDOM TOOTH EXTRACTION     Social History   Tobacco Use   Smoking status: Every Day    Packs/day: 0.75    Types: Cigarettes   Smokeless tobacco: Never  Vaping Use   Vaping Use: Never used  Substance Use Topics   Alcohol use: Yes    Alcohol/week: 0.0 standard drinks of alcohol    Comment: occ   Drug use: No   History reviewed. No pertinent family history. No Known  Allergies Current Outpatient Medications on File Prior to Visit  Medication Sig Dispense Refill   cetirizine (ZYRTEC) 10 MG tablet Take 10 mg by mouth daily as needed for allergies.     famotidine (PEPCID) 20 MG tablet Take 20 mg by mouth daily as needed.     No current facility-administered medications on file prior to visit.      Review of Systems  Constitutional:  Negative for activity change, appetite change, fatigue, fever and unexpected weight change.  HENT:  Positive for congestion and postnasal drip. Negative for ear pain, nosebleeds, rhinorrhea, sinus pressure and sore throat.   Eyes:  Negative for pain, redness, itching and visual disturbance.  Respiratory:  Positive for cough. Negative for shortness of breath and wheezing.   Cardiovascular:  Negative for chest pain, palpitations and leg swelling.  Gastrointestinal:  Negative for abdominal distention, abdominal pain, blood in stool, constipation, diarrhea, nausea and vomiting.  Endocrine: Negative for cold intolerance, polydipsia and polyuria.  Genitourinary:  Negative for difficulty urinating, dysuria, flank pain, frequency, hematuria and urgency.  Musculoskeletal:  Negative for arthralgias, back pain and myalgias.  Skin:  Negative for pallor and rash.  Allergic/Immunologic: Negative for environmental allergies and immunocompromised  state.  Neurological:  Positive for headaches. Negative for dizziness, tremors, syncope, weakness and numbness.  Hematological:  Negative for adenopathy. Does not bruise/bleed easily.  Psychiatric/Behavioral:  Negative for decreased concentration and dysphoric mood. The patient is not nervous/anxious.        Objective:   Physical Exam Constitutional:      General: She is not in acute distress.    Appearance: Normal appearance. She is well-developed and normal weight. She is not ill-appearing.  HENT:     Head: Normocephalic and atraumatic.     Comments: Bilateral maxillary and frontal sinus  tenderness    Right Ear: Tympanic membrane, ear canal and external ear normal.     Left Ear: Tympanic membrane, ear canal and external ear normal.     Nose: Congestion and rhinorrhea present.     Mouth/Throat:     Pharynx: Oropharynx is clear. No oropharyngeal exudate or posterior oropharyngeal erythema.     Comments: Clear pnd Eyes:     General: No scleral icterus.       Right eye: No discharge.        Left eye: No discharge.     Conjunctiva/sclera: Conjunctivae normal.     Pupils: Pupils are equal, round, and reactive to light.  Cardiovascular:     Rate and Rhythm: Normal rate and regular rhythm.  Pulmonary:     Effort: Pulmonary effort is normal. No respiratory distress.     Breath sounds: Normal breath sounds. No stridor. No wheezing, rhonchi or rales.     Comments: Good air exch No rales or rhonchi  No wheeze even on forced exp Musculoskeletal:     Cervical back: Normal range of motion and neck supple.  Lymphadenopathy:     Cervical: No cervical adenopathy.  Skin:    General: Skin is warm and dry.     Findings: No rash.  Neurological:     Mental Status: She is alert.     Cranial Nerves: No cranial nerve deficit.     Coordination: Coordination normal.  Psychiatric:        Mood and Affect: Mood normal.           Assessment & Plan:   Problem List Items Addressed This Visit       Respiratory   Acute sinusitis    With uri , cough and persistent headache  Px augmentin  If not improving consider prednisone This may be triggering a migraine type of HA as well  Disc symptom control (mucinex DM) Update if not starting to improve in a week or if worsening   Enc to consider smoking cessation        Relevant Medications   amoxicillin-clavulanate (AUGMENTIN) 875-125 MG tablet

## 2022-07-26 NOTE — Assessment & Plan Note (Signed)
With uri , cough and persistent headache  Px augmentin  If not improving consider prednisone This may be triggering a migraine type of HA as well  Disc symptom control (mucinex DM) Update if not starting to improve in a week or if worsening   Enc to consider smoking cessation

## 2022-11-08 ENCOUNTER — Ambulatory Visit: Payer: 59 | Admitting: Family Medicine

## 2022-11-09 ENCOUNTER — Ambulatory Visit: Payer: 59 | Admitting: Family Medicine

## 2022-11-09 ENCOUNTER — Encounter: Payer: Self-pay | Admitting: Family Medicine

## 2022-11-09 VITALS — BP 102/62 | HR 87 | Temp 98.2°F | Ht 65.5 in | Wt 150.2 lb

## 2022-11-09 DIAGNOSIS — R69 Illness, unspecified: Secondary | ICD-10-CM | POA: Diagnosis not present

## 2022-11-09 DIAGNOSIS — J029 Acute pharyngitis, unspecified: Secondary | ICD-10-CM

## 2022-11-09 DIAGNOSIS — F17200 Nicotine dependence, unspecified, uncomplicated: Secondary | ICD-10-CM

## 2022-11-09 DIAGNOSIS — F17210 Nicotine dependence, cigarettes, uncomplicated: Secondary | ICD-10-CM

## 2022-11-09 LAB — POCT RAPID STREP A (OFFICE): Rapid Strep A Screen: NEGATIVE

## 2022-11-09 MED ORDER — NICOTINE 10 MG IN INHA: 1.0000 | RESPIRATORY_TRACT | 3 refills | Status: DC | PRN

## 2022-11-09 NOTE — Patient Instructions (Addendum)
Your exam is reassuring   Drink fluids If throat pain returns or worsens and does not improve let us know  Try salt water gargle  Watch for fever /congestion/sinus pain or worse cough   Try the nicotine inhaler to quit smoking

## 2022-11-09 NOTE — Assessment & Plan Note (Signed)
Wants to quit after recent uri and sore throat  Disc options  Nicotine craving and routine are hard to break   Handout given  Px nicotine inhaler and disc use with gradual wean  Wants to try this  Is motivated  Will update if needs further help /guidance

## 2022-11-09 NOTE — Assessment & Plan Note (Addendum)
Intermittent since uri in December  R sided with ear pain as well  Today pain is not there  ? If she may have had an ulcer that healed  Also post nasal drip Neg strep test today  Nl exam  Disc smoking cessation in detail / plans to try nicotine inhaler if it is available   Update if not starting to improve in a week or if worsening

## 2022-11-09 NOTE — Progress Notes (Signed)
Subjective:    Patient ID: Nicole Mcdaniel, female    DOB: [birth year], 28 y.o.   MRN: [id number]  HPI Pt presents with c/o sore throat   Wt Readings from Last 3 Encounters:  11/09/22 150 lb 4 oz (68.2 kg)  07/26/22 151 lb 4 oz (68.6 kg)  02/21/22 152 lb 8 oz (69.2 kg)   24.62 kg/m  Vitals:   11/09/22 1019  BP: 102/62  Pulse: 87  Temp: 98.2 F (36.8 C)  SpO2: 98%    Before xmas her bf gave her a cold  St, cough, cong  That got better   Since then some prod cough on /off Then ST on R side on and off  It bothers her ear  From Sunday to wed really bothered her and today is better   Mucous is brown in color  No wheeze  No sob  No fever    Smoking status - still smoking but really wants to quit before her wedding in October  Smokes 1/2 ppd or a little more  Stress related and habit  No longer smokes in her car   Negative strep test today  Results for orders placed or performed in visit on 11/09/22  POCT rapid strep A  Result Value Ref Range   Rapid Strep A Screen Negative Negative      Patient Active Problem List   Diagnosis Date Noted   Acute sinusitis 07/26/2022   Family history of malignant melanoma of skin 02/21/2022   Visit for routine gyn exam 08/27/2018   Sore throat 03/26/2017   Smoking 05/13/2016   Encounter for routine gynecological examination 05/12/2016   Screen for STD (sexually transmitted disease) 05/12/2016   Routine general medical examination at a health care facility 04/30/2016   ALLERGIC RHINITIS 01/21/2008   History reviewed. No pertinent past medical history. Past Surgical History:  Procedure Laterality Date   WISDOM TOOTH EXTRACTION     Social History   Tobacco Use   Smoking status: Every Day    Packs/day: 0.75    Types: Cigarettes   Smokeless tobacco: Never  Vaping Use   Vaping Use: Never used  Substance Use Topics   Alcohol use: Yes    Alcohol/week: 0.0 standard drinks of alcohol    Comment: occ   Drug use: No    History reviewed. No pertinent family history. No Known Allergies Current Outpatient Medications on File Prior to Visit  Medication Sig Dispense Refill   cetirizine (ZYRTEC) 10 MG tablet Take 10 mg by mouth daily as needed for allergies.     famotidine (PEPCID) 20 MG tablet Take 20 mg by mouth daily as needed.     No current facility-administered medications on file prior to visit.     Review of Systems  Constitutional:  Negative for activity change, appetite change, fatigue, fever and unexpected weight change.  HENT:  Positive for sore throat. Negative for congestion, ear pain, rhinorrhea, sinus pressure, sinus pain, trouble swallowing and voice change.   Eyes:  Negative for pain, redness and visual disturbance.  Respiratory:  Positive for cough. Negative for chest tightness, shortness of breath and wheezing.   Cardiovascular:  Negative for chest pain and palpitations.  Gastrointestinal:  Negative for abdominal pain, blood in stool, constipation and diarrhea.  Endocrine: Negative for polydipsia and polyuria.  Genitourinary:  Negative for dysuria, frequency and urgency.  Musculoskeletal:  Negative for arthralgias, back pain and myalgias.  Skin:  Negative for pallor and rash.  Allergic/Immunologic: Negative  for environmental allergies.  Neurological:  Negative for dizziness, syncope and headaches.  Hematological:  Negative for adenopathy. Does not bruise/bleed easily.  Psychiatric/Behavioral:  Negative for decreased concentration and dysphoric mood. The patient is not nervous/anxious.        Objective:   Physical Exam Constitutional:      General: She is not in acute distress.    Appearance: She is well-developed and normal weight. She is not ill-appearing or diaphoretic.  HENT:     Head: Normocephalic and atraumatic.     Right Ear: Tympanic membrane and ear canal normal.     Left Ear: Tympanic membrane and ear canal normal.     Nose:     Comments: Boggy nares      Mouth/Throat:     Mouth: Mucous membranes are moist. No oral lesions.     Pharynx: Oropharynx is clear. Uvula midline. No pharyngeal swelling, oropharyngeal exudate, posterior oropharyngeal erythema or uvula swelling.     Comments: Clear pnd   No lesions, swelling or erythema  Cardiovascular:     Rate and Rhythm: Normal rate and regular rhythm.     Heart sounds: Normal heart sounds.  Pulmonary:     Effort: Pulmonary effort is normal. No respiratory distress.     Breath sounds: Normal breath sounds. No stridor. No wheezing, rhonchi or rales.     Comments: No wheeze even on forced exp Musculoskeletal:     Cervical back: Normal range of motion and neck supple.  Lymphadenopathy:     Cervical: No cervical adenopathy.  Neurological:     Mental Status: She is alert.           Assessment & Plan:   Problem List Items Addressed This Visit       Other   Smoking    Wants to quit after recent uri and sore throat  Disc options  Nicotine craving and routine are hard to break   Handout given  Px nicotine inhaler and disc use with gradual wean  Wants to try this  Is motivated  Will update if needs further help /guidance       Sore throat - Primary    Intermittent since uri in December  R sided with ear pain as well  Today pain is not there  ? If she may have had an ulcer that healed  Also post nasal drip Neg strep test today  Nl exam  Disc smoking cessation in detail / plans to try nicotine inhaler if it is available   Update if not starting to improve in a week or if worsening        Relevant Orders   POCT rapid strep A

## 2022-12-28 ENCOUNTER — Ambulatory Visit: Payer: 59 | Admitting: Family Medicine

## 2022-12-28 ENCOUNTER — Encounter: Payer: Self-pay | Admitting: Family Medicine

## 2022-12-28 VITALS — BP 116/70 | HR 70 | Temp 97.9°F | Ht 65.5 in | Wt 148.4 lb

## 2022-12-28 DIAGNOSIS — F17200 Nicotine dependence, unspecified, uncomplicated: Secondary | ICD-10-CM | POA: Diagnosis not present

## 2022-12-28 DIAGNOSIS — J209 Acute bronchitis, unspecified: Secondary | ICD-10-CM | POA: Diagnosis not present

## 2022-12-28 MED ORDER — FLUTICASONE PROPIONATE 50 MCG/ACT NA SUSP: 2.0000 | Freq: Every day | NASAL | 6 refills | Status: DC

## 2022-12-28 MED ORDER — PREDNISONE 10 MG PO TABS: ORAL_TABLET | ORAL | 0 refills | Status: DC

## 2022-12-28 MED ORDER — ALBUTEROL SULFATE HFA 108 (90 BASE) MCG/ACT IN AERS: 2.0000 | INHALATION_SPRAY | RESPIRATORY_TRACT | 0 refills | Status: DC | PRN

## 2022-12-28 NOTE — Assessment & Plan Note (Signed)
Likely viral with some reactive airways from smoking  Reassuring exam Disc symptom care see AVS  ER precautions noted Px prednisone 40 mg taper/disc poss side eff Albuterol mdi prn/ flonase ns  Update if not starting to improve in a week or if worsening    Will watch for ss of pna or sinusitis (rev)

## 2022-12-28 NOTE — Progress Notes (Signed)
Subjective:    Patient ID: Nicole Mcdaniel, female    DOB: 20-Jul-1996, 27 y.o.   MRN: [id number]  HPI Pt presents with sinus symptoms   Wt Readings from Last 3 Encounters:  12/28/22 148 lb 6 oz (67.3 kg)  11/09/22 150 lb 4 oz (68.2 kg)  07/26/22 151 lb 4 oz (68.6 kg)   24.32 kg/m  Vitals:   12/28/22 1128  BP: 116/70  Pulse: 70  Temp: 97.9 F (36.6 C)  SpO2: 97%   Had a cold mid feb or allergies  Got better  This Saturday - much worse  Started with dry cough  No sob  Nasal congestion  Now prod cough since Monday - ?poss wheezing  Phlegm is white   No fever   Some sinus pressure over teeth-comes and goes Clear mucous so far   No n/v/d   Has neg covid test at home   Current smoker   Otc Day quil   Patient Active Problem List   Diagnosis Date Noted   Acute bronchitis 12/28/2022   Acute sinusitis 07/26/2022   Family history of malignant melanoma of skin 02/21/2022   Visit for routine gyn exam 08/27/2018   Smoking 05/13/2016   Encounter for routine gynecological examination 05/12/2016   Screen for STD (sexually transmitted disease) 05/12/2016   Routine general medical examination at a health care facility 04/30/2016   ALLERGIC RHINITIS 01/21/2008   History reviewed. No pertinent past medical history. Past Surgical History:  Procedure Laterality Date   WISDOM TOOTH EXTRACTION     Social History   Tobacco Use   Smoking status: Every Day    Packs/day: 0.75    Types: Cigarettes   Smokeless tobacco: Never  Vaping Use   Vaping Use: Never used  Substance Use Topics   Alcohol use: Yes    Alcohol/week: 0.0 standard drinks of alcohol    Comment: occ   Drug use: No   History reviewed. No pertinent family history. No Known Allergies Current Outpatient Medications on File Prior to Visit  Medication Sig Dispense Refill   cetirizine (ZYRTEC) 10 MG tablet Take 10 mg by mouth daily as needed for allergies.     famotidine (PEPCID) 20 MG tablet Take 20 mg by  mouth daily as needed.     nicotine (NICOTROL) 10 MG inhaler Inhale 1 Cartridge (1 continuous puffing total) into the lungs as needed for smoking cessation. (Patient not taking: Reported on 12/28/2022) 42 each 3   No current facility-administered medications on file prior to visit.    Review of Systems  Constitutional:  Positive for appetite change and fatigue. Negative for fever.  HENT:  Positive for congestion, postnasal drip, rhinorrhea, sinus pressure, sneezing and sore throat. Negative for ear pain.   Eyes:  Negative for pain and discharge.  Respiratory:  Positive for cough and wheezing. Negative for shortness of breath and stridor.   Cardiovascular:  Negative for chest pain.  Gastrointestinal:  Negative for diarrhea, nausea and vomiting.  Genitourinary:  Negative for frequency, hematuria and urgency.  Musculoskeletal:  Negative for arthralgias and myalgias.  Skin:  Negative for rash.  Neurological:  Positive for headaches. Negative for dizziness, weakness and light-headedness.  Psychiatric/Behavioral:  Negative for confusion and dysphoric mood.        Objective:   Physical Exam Constitutional:      General: She is not in acute distress.    Appearance: Normal appearance. She is well-developed and normal weight. She is not ill-appearing, toxic-appearing or  diaphoretic.  HENT:     Head: Normocephalic and atraumatic.     Comments: Nares are injected and congested     No sinus symptoms    Right Ear: Tympanic membrane, ear canal and external ear normal.     Left Ear: Tympanic membrane, ear canal and external ear normal.     Nose: Congestion and rhinorrhea present.     Mouth/Throat:     Mouth: Mucous membranes are moist.     Pharynx: Oropharynx is clear. No oropharyngeal exudate or posterior oropharyngeal erythema.     Comments: Clear pnd  Eyes:     General:        Right eye: No discharge.        Left eye: No discharge.     Conjunctiva/sclera: Conjunctivae normal.     Pupils:  Pupils are equal, round, and reactive to light.  Cardiovascular:     Rate and Rhythm: Normal rate.     Heart sounds: Normal heart sounds.  Pulmonary:     Effort: Pulmonary effort is normal. No respiratory distress.     Breath sounds: No stridor. Rhonchi present. No wheezing or rales.     Comments: Diffuse scattered rhonchi Scant wheeze on forced exp  No prolonged exp phase Chest:     Chest wall: No tenderness.  Musculoskeletal:     Cervical back: Normal range of motion and neck supple.  Lymphadenopathy:     Cervical: No cervical adenopathy.  Skin:    General: Skin is warm and dry.     Capillary Refill: Capillary refill takes less than 2 seconds.     Findings: No rash.  Neurological:     Mental Status: She is alert.     Cranial Nerves: No cranial nerve deficit.  Psychiatric:        Mood and Affect: Mood normal.           Assessment & Plan:   Problem List Items Addressed This Visit       Respiratory   Acute bronchitis - Primary    Likely viral with some reactive airways from smoking  Reassuring exam Disc symptom care see AVS  ER precautions noted Px prednisone 40 mg taper/disc poss side eff Albuterol mdi prn/ flonase ns  Update if not starting to improve in a week or if worsening    Will watch for ss of pna or sinusitis (rev)        Other   Smoking    Disc in detail risks of smoking and possible outcomes including copd, vascular/ heart disease, cancer , respiratory and sinus infections  Pt voices understanding Pt has bronchitis now  Enc to start cutting back  She is not ready to quit   Not currently using nicotine inhaler

## 2022-12-28 NOTE — Patient Instructions (Addendum)
Drink fluids and rest  mucinex DM is good for cough and congestion  Nasal saline for congestion as needed  Tylenol for fever or pain or headache  Please alert [place] if symptoms worsen (if severe or short of breath please go to the ER)   Take prednisone as directed for bronchitis  It may make you feel hungry and hyper   If you feel tight or wheezy - the albuterol inhaler may help as needed   Flonase daily may help congestion as well   Update if not starting to improve in a week or if worsening   Keep thinking about quitting smoking

## 2022-12-28 NOTE — Assessment & Plan Note (Signed)
Disc in detail risks of smoking and possible outcomes including copd, vascular/ heart disease, cancer , respiratory and sinus infections  Pt voices understanding Pt has bronchitis now  Enc to start cutting back  She is not ready to quit   Not currently using nicotine inhaler

## 2023-04-30 DIAGNOSIS — D2272 Melanocytic nevi of left lower limb, including hip: Secondary | ICD-10-CM | POA: Diagnosis not present

## 2023-04-30 DIAGNOSIS — D2261 Melanocytic nevi of right upper limb, including shoulder: Secondary | ICD-10-CM | POA: Diagnosis not present

## 2023-04-30 DIAGNOSIS — L814 Other melanin hyperpigmentation: Secondary | ICD-10-CM | POA: Diagnosis not present

## 2023-04-30 DIAGNOSIS — L858 Other specified epidermal thickening: Secondary | ICD-10-CM | POA: Diagnosis not present

## 2023-04-30 DIAGNOSIS — D225 Melanocytic nevi of trunk: Secondary | ICD-10-CM | POA: Diagnosis not present

## 2023-04-30 DIAGNOSIS — D2271 Melanocytic nevi of right lower limb, including hip: Secondary | ICD-10-CM | POA: Diagnosis not present

## 2023-04-30 DIAGNOSIS — D2262 Melanocytic nevi of left upper limb, including shoulder: Secondary | ICD-10-CM | POA: Diagnosis not present

## 2023-04-30 DIAGNOSIS — X32XXXA Exposure to sunlight, initial encounter: Secondary | ICD-10-CM | POA: Diagnosis not present

## 2023-05-14 ENCOUNTER — Telehealth: Payer: Self-pay | Admitting: Family Medicine

## 2023-05-14 DIAGNOSIS — Z0000 Encounter for general adult medical examination without abnormal findings: Secondary | ICD-10-CM

## 2023-05-14 NOTE — Telephone Encounter (Signed)
-----   Message from Lovena Neighbours sent at 05/01/2023 10:15 AM EDT ----- Regarding: Labs 7.23.24 Please put physical lab orders in future. Thank you, Denny Peon

## 2023-05-15 ENCOUNTER — Other Ambulatory Visit (INDEPENDENT_AMBULATORY_CARE_PROVIDER_SITE_OTHER): Payer: 59

## 2023-05-15 DIAGNOSIS — Z0000 Encounter for general adult medical examination without abnormal findings: Secondary | ICD-10-CM

## 2023-05-15 LAB — COMPREHENSIVE METABOLIC PANEL
ALT: 11 U/L (ref 0–35)
AST: 13 U/L (ref 0–37)
Albumin: 4.4 g/dL (ref 3.5–5.2)
Alkaline Phosphatase: 57 U/L (ref 39–117)
BUN: 18 mg/dL (ref 6–23)
CO2: 26 mEq/L (ref 19–32)
Calcium: 9.5 mg/dL (ref 8.4–10.5)
Chloride: 105 mEq/L (ref 96–112)
Creatinine, Ser: 0.55 mg/dL (ref 0.40–1.20)
GFR: 125.07 mL/min (ref >60.00)
Glucose, Bld: 96 mg/dL (ref 70–99)
Potassium: 4.6 mEq/L (ref 3.5–5.1)
Sodium: 139 mEq/L (ref 135–145)
Total Bilirubin: 0.4 mg/dL (ref 0.2–1.2)
Total Protein: 6.8 g/dL (ref 6.0–8.3)

## 2023-05-15 LAB — CBC WITH DIFFERENTIAL/PLATELET
Basophils Absolute: 0 10*3/uL (ref 0.0–0.1)
Basophils Relative: 0.5 % (ref 0.0–3.0)
Eosinophils Absolute: 0.1 10*3/uL (ref 0.0–0.7)
Eosinophils Relative: 1.9 % (ref 0.0–5.0)
HCT: 43.4 % (ref 36.0–46.0)
Hemoglobin: 14 g/dL (ref 12.0–15.0)
Lymphocytes Relative: 35 % (ref 12.0–46.0)
Lymphs Abs: 2.1 10*3/uL (ref 0.7–4.0)
MCHC: 32.3 g/dL (ref 30.0–36.0)
MCV: 89.8 fl (ref 78.0–100.0)
Monocytes Absolute: 0.5 10*3/uL (ref 0.1–1.0)
Monocytes Relative: 8.2 % (ref 3.0–12.0)
Neutro Abs: 3.3 10*3/uL (ref 1.4–7.7)
Neutrophils Relative %: 54.4 % (ref 43.0–77.0)
Platelets: 247 10*3/uL (ref 150.0–400.0)
RBC: 4.83 Mil/uL (ref 3.87–5.11)
RDW: 14.1 % (ref 11.5–15.5)
WBC: 6.1 10*3/uL (ref 4.0–10.5)

## 2023-05-15 LAB — LIPID PANEL
Cholesterol: 213 mg/dL — ABNORMAL HIGH (ref 0–200)
HDL: 65.1 mg/dL (ref >39.00)
LDL Cholesterol: 135 mg/dL — ABNORMAL HIGH (ref 0–99)
NonHDL: 147.65
Total CHOL/HDL Ratio: 3
Triglycerides: 63 mg/dL (ref 0.0–149.0)
VLDL: 12.6 mg/dL (ref 0.0–40.0)

## 2023-05-15 LAB — TSH: TSH: 1.56 u[IU]/mL (ref 0.35–5.50)

## 2023-05-16 ENCOUNTER — Encounter: Payer: Self-pay | Admitting: Family Medicine

## 2023-05-16 ENCOUNTER — Ambulatory Visit: Payer: 59 | Admitting: Family Medicine

## 2023-05-16 VITALS — BP 128/68 | HR 74 | Temp 98.3°F | Ht 65.5 in | Wt 150.4 lb

## 2023-05-16 DIAGNOSIS — R42 Dizziness and giddiness: Secondary | ICD-10-CM | POA: Insufficient documentation

## 2023-05-16 DIAGNOSIS — F17200 Nicotine dependence, unspecified, uncomplicated: Secondary | ICD-10-CM

## 2023-05-16 DIAGNOSIS — J301 Allergic rhinitis due to pollen: Secondary | ICD-10-CM

## 2023-05-16 DIAGNOSIS — G44209 Tension-type headache, unspecified, not intractable: Secondary | ICD-10-CM

## 2023-05-16 NOTE — Assessment & Plan Note (Signed)
Pt has a difficult to describe feeling in head- sometimes pressure/ occational ha at and of day ? If possible atypical migraine vs vertiginous symptoms  Reassuring exam /no neuro def  Encouraged to use flonase daily instructed of prn  Increase hydration Continue cutting caffeine Consider quitting smoking  Follow up planned next wk-will discuss further then

## 2023-05-16 NOTE — Progress Notes (Signed)
Subjective:    Patient ID: Nicole Mcdaniel, female    DOB: 11-11-1995, 27 y.o.   MRN: [id number]  HPI  Wt Readings from Last 3 Encounters:  05/16/23 150 lb 6 oz (68.2 kg)  12/28/22 148 lb 6 oz (67.3 kg)  11/09/22 150 lb 4 oz (68.2 kg)   24.64 kg/m  Vitals:   05/16/23 1454  BP: 128/68  Pulse: 74  Temp: 98.3 F (36.8 C)  SpO2: 98%    Pt presents with c/o head sympotms for a week   Head feels funny  Similar to light headed but not exactly / similar to dizzy but not exactly  More floaters recently in vision   Sometimes by end of the day develops into a headache  Both sides - band like distribution / dull and constant   Has not had eyes checked recently  Glasses are scratched  Otherwise vision is stable  Fair amt of screen time computer/phone   Caffeine - has cut out energy drinks (used to be twice daily) - cut that early July  Some coffee at work- max 2 cups Sleep is good  Smoking- still 1/2 ppd   Some nausea occational after eating  Not pregnant   Has thought about [facility]- getting married in October and does want to start a family  Not a candidate due to smoking   No new sinus symptoms    Finished a period a week ago  LMP 7/9 and lasting 5 days  Was a bit heavier than usual   She did take a plan B in may   Stress level is high - planning a wedding / family / social and work      Patient Active Problem List   Diagnosis Date Noted   Light headed 05/16/2023   Tension headache 05/16/2023   Family history of malignant melanoma of skin 02/21/2022   Visit for routine gyn exam 08/27/2018   Smoking 05/13/2016   Encounter for routine gynecological examination 05/12/2016   Screen for STD (sexually transmitted disease) 05/12/2016   Routine general medical examination at a health care facility 04/30/2016   Allergic rhinitis 01/21/2008   History reviewed. No pertinent past medical history. Past Surgical History:  Procedure Laterality Date   WISDOM TOOTH  EXTRACTION     Social History   Tobacco Use   Smoking status: Every Day    Current packs/day: 0.75    Types: Cigarettes   Smokeless tobacco: Never  Vaping Use   Vaping status: Never Used  Substance Use Topics   Alcohol use: Yes    Alcohol/week: 0.0 standard drinks of alcohol    Comment: occ   Drug use: No   History reviewed. No pertinent family history. No Known Allergies Current Outpatient Medications on File Prior to Visit  Medication Sig Dispense Refill   cetirizine (ZYRTEC) 10 MG tablet Take 10 mg by mouth daily as needed for allergies.     famotidine (PEPCID) 20 MG tablet Take 20 mg by mouth daily as needed.     fluticasone (FLONASE) 50 MCG/ACT nasal spray Place 2 sprays into both nostrils daily. 16 g 6   No current facility-administered medications on file prior to visit.    Review of Systems  Constitutional:  Negative for activity change, appetite change, fatigue, fever and unexpected weight change.  HENT:  Negative for congestion, ear pain, rhinorrhea, sinus pressure and sore throat.   Eyes:  Negative for pain, redness and visual disturbance.  Respiratory:  Negative for  cough, shortness of breath and wheezing.   Cardiovascular:  Negative for chest pain and palpitations.  Gastrointestinal:  Negative for abdominal pain, blood in stool, constipation and diarrhea.  Endocrine: Negative for polydipsia and polyuria.  Genitourinary:  Negative for dysuria, frequency and urgency.  Musculoskeletal:  Negative for arthralgias, back pain and myalgias.  Skin:  Negative for pallor and rash.  Allergic/Immunologic: Negative for environmental allergies.  Neurological:  Positive for light-headedness and headaches. Negative for dizziness, tremors, seizures, syncope, facial asymmetry, speech difficulty, weakness and numbness.  Hematological:  Negative for adenopathy. Does not bruise/bleed easily.  Psychiatric/Behavioral:  Negative for decreased concentration and dysphoric mood. The  patient is not nervous/anxious.        Objective:   Physical Exam Constitutional:      General: She is not in acute distress.    Appearance: She is well-developed and normal weight. She is not ill-appearing or diaphoretic.  HENT:     Head: Normocephalic and atraumatic.     Comments: No facial or temporal tenderness    Right Ear: Tympanic membrane, ear canal and external ear normal.     Left Ear: Tympanic membrane, ear canal and external ear normal.     Nose: Nose normal.     Comments: Boggy nares     Mouth/Throat:     Pharynx: No oropharyngeal exudate.  Eyes:     General: No scleral icterus.       Right eye: No discharge.        Left eye: No discharge.     Conjunctiva/sclera: Conjunctivae normal.     Pupils: Pupils are equal, round, and reactive to light.     Comments: No nystagmus  Neck:     Thyroid: No thyromegaly.     Vascular: No carotid bruit or JVD.     Trachea: No tracheal deviation.  Cardiovascular:     Rate and Rhythm: Normal rate and regular rhythm.     Heart sounds: Normal heart sounds. No murmur heard. Pulmonary:     Effort: Pulmonary effort is normal. No respiratory distress.     Breath sounds: Normal breath sounds. No wheezing or rales.  Abdominal:     General: Bowel sounds are normal. There is no distension.     Palpations: Abdomen is soft. There is no mass.     Tenderness: There is no abdominal tenderness.  Musculoskeletal:        General: No tenderness.     Cervical back: Full passive range of motion without pain, normal range of motion and neck supple.  Lymphadenopathy:     Cervical: No cervical adenopathy.  Skin:    General: Skin is warm and dry.     Coloration: Skin is not pale.     Findings: No rash.  Neurological:     Mental Status: She is alert and oriented to person, place, and time.     Cranial Nerves: No cranial nerve deficit, dysarthria or facial asymmetry.     Sensory: Sensation is intact. No sensory deficit.     Motor: No weakness,  tremor, atrophy, abnormal muscle tone or pronator drift.     Coordination: Romberg sign negative. Coordination normal. Finger-Nose-Finger Test normal.     Gait: Gait is intact. Gait and tandem walk normal.     Deep Tendon Reflexes: Reflexes are normal and symmetric. Reflexes normal.     Comments: No focal cerebellar signs   Psychiatric:        Behavior: Behavior normal.  Thought Content: Thought content normal.           Assessment & Plan:   Problem List Items Addressed This Visit       Respiratory   Allergic rhinitis    Encouraged her to use flonase daily instructed of prn for congestion and possible inner ear symptoms  Continues zyrtec         Other   Tension headache    At end of day  Some light headedness during the day (see that a/p)  Encouraged her to continue gradually cutting caffeine  Get her annual eye exam  Increase hydration  Follow up next wk as planned       Smoking    Disc in detail risks of smoking and possible outcomes including copd, vascular/ heart disease, cancer , respiratory and sinus infections  Pt voices understanding  Not ready to quit  1/2 ppd Thinking about it soon      Light headed - Primary    Pt has a difficult to describe feeling in head- sometimes pressure/ occational ha at and of day ? If possible atypical migraine vs vertiginous symptoms  Reassuring exam /no neuro def  Encouraged to use flonase daily instructed of prn  Increase hydration Continue cutting caffeine Consider quitting smoking  Follow up planned next wk-will discuss further then

## 2023-05-16 NOTE — Assessment & Plan Note (Signed)
Disc in detail risks of smoking and possible outcomes including copd, vascular/ heart disease, cancer , respiratory and sinus infections  Pt voices understanding  Not ready to quit  1/2 ppd Thinking about it soon

## 2023-05-16 NOTE — Assessment & Plan Note (Signed)
At end of day  Some light headedness during the day (see that a/p)  Encouraged her to continue gradually cutting caffeine  Get her annual eye exam  Increase hydration  Follow up next wk as planned

## 2023-05-16 NOTE — Assessment & Plan Note (Signed)
Encouraged her to use flonase daily instructed of prn for congestion and possible inner ear symptoms  Continues zyrtec

## 2023-05-16 NOTE — Patient Instructions (Addendum)
Get to your eye specialist and get an exam   Gradually cut coffee to 1 cup per day   Aim for 64 oz of fluid daily -mostly water   Keep thinking about quitting smoking   Your neurologic exam is normal   Inner ear issue / congestion can cause some off balance or dizzy feeling   Use flonase every day for at least a month to open sinus area     We will check in soon as planned

## 2023-05-22 ENCOUNTER — Encounter: Payer: Self-pay | Admitting: Family Medicine

## 2023-05-22 ENCOUNTER — Ambulatory Visit (INDEPENDENT_AMBULATORY_CARE_PROVIDER_SITE_OTHER): Payer: 59 | Admitting: Family Medicine

## 2023-05-22 VITALS — BP 110/80 | HR 74 | Temp 97.1°F | Ht 65.5 in | Wt 149.0 lb

## 2023-05-22 DIAGNOSIS — Z01419 Encounter for gynecological examination (general) (routine) without abnormal findings: Secondary | ICD-10-CM

## 2023-05-22 DIAGNOSIS — Z808 Family history of malignant neoplasm of other organs or systems: Secondary | ICD-10-CM

## 2023-05-22 DIAGNOSIS — R42 Dizziness and giddiness: Secondary | ICD-10-CM

## 2023-05-22 DIAGNOSIS — Z0000 Encounter for general adult medical examination without abnormal findings: Secondary | ICD-10-CM

## 2023-05-22 DIAGNOSIS — E785 Hyperlipidemia, unspecified: Secondary | ICD-10-CM | POA: Diagnosis not present

## 2023-05-22 DIAGNOSIS — F17200 Nicotine dependence, unspecified, uncomplicated: Secondary | ICD-10-CM | POA: Diagnosis not present

## 2023-05-22 NOTE — Patient Instructions (Addendum)
Call [facility] - if they cannot get you in earlier then let us know   When you are ready to quit smoking - start with nicotine replacement  If patch -would start with 14 mcg   There is also gum and inhalers   In the future consider some walking/ cardio and strength training for exercise Add some strength training to your routine, this is important for bone and brain health and can reduce your risk of falls and help your body use insulin properly and regulate weight  Light weights, exercise bands , and internet videos are a good way to start  Yoga (chair or regular), machines , floor exercises or a gym with machines are also good options   For cholesterol Avoid red meat/ fried foods/ egg yolks/ fatty breakfast meats/ butter, cheese and high fat dairy/ and shellfish    I put the referral in for obgyn Please let us know if you don't hear in 1-2 weeks   If you decide to start trying for pregnancy - get a prenatal vitamin over the counter and take one a day with food

## 2023-05-22 NOTE — Assessment & Plan Note (Signed)
Utd derm care/skin cancer screening  Using sun protection

## 2023-05-22 NOTE — Assessment & Plan Note (Signed)
Due for annual exam Interested in starting family soon Will ref to get est with obgyn  Encouraged to start pnv over the counter when she starts trying  Discussed need for smoking cessation also

## 2023-05-22 NOTE — Assessment & Plan Note (Signed)
Disc in detail risks of smoking and possible outcomes including copd, vascular/ heart disease, cancer , respiratory and sinus infections  Pt voices understanding She is almost ready to quit Currently 1/2 ppd  Will try 14 mcg nicotine patch when ready

## 2023-05-22 NOTE — Progress Notes (Signed)
Subjective:    Patient ID: Nicole Mcdaniel, female    DOB: 02-Mar-1996, 27 y.o.   MRN: [id number]  HPI  Here for health maintenance exam and to review chronic medical problems   Wt Readings from Last 3 Encounters:  05/22/23 149 lb (67.6 kg)  05/16/23 150 lb 6 oz (68.2 kg)  12/28/22 148 lb 6 oz (67.3 kg)   24.42 kg/m  Vitals:   05/22/23 1511  BP: 110/80  Pulse: 74  Temp: (!) 97.1 F (36.2 C)  SpO2: 98%    Immunization History  Administered Date(s) Administered   HPV Quadrivalent 07/05/2012, 09/06/2012, 01/03/2013   Influenza,inj,Quad PF,6+ Mos 09/24/2015, 08/27/2018, 08/06/2019   Rabies, IM 08/01/2021, 08/08/2021   Tdap 06/03/2012, 02/05/2017    Health Maintenance Due  Topic Date Due   PAP-Cervical Cytology Screening  08/27/2021   Was seen for light headedness/ head symptoms on 7/24 Still has this  Thinks it is from her eyes/vision  She made appt for exam    Self breast exam: no lumps    Gyn care/ pap was 08/2018  due for pap but wants to skip  Is considering starting a family in the future  Interested in est with obgyn    Menses  - regular , heavy at times  Cramps for first day   Monogamous    Smoking status : still smoking about 1/2 ppd  Plans to start patches  Using condoms for contraception   Getting married this year    Family history of melanoma  Just had visit this month / no moles removes  Uses sun protectoin    Bone  Falls-none  Fractures-none  Supplements -none / does eat a balanced diet  Exercise : none regular   Used to drink 2 drinks per night  Has cut this  Now has a drink over the weekend /no longer drinking on week days    Mood    05/22/2023    3:19 PM 05/16/2023    3:01 PM 12/28/2022   11:36 AM 11/09/2022   10:28 AM 02/21/2022   10:59 AM  Depression screen PHQ 2/9  Decreased Interest 0 0 0 0 1  Down, Depressed, Hopeless 1 1 0 0 0  PHQ - 2 Score 1 1 0 0 1  Altered sleeping 2 1 0 1 0  Tired, decreased energy 2 1 2 1 1    Change in appetite 0 0 0 0 0  Feeling bad or failure about yourself  0 0 0 0 0  Trouble concentrating 0 1 0 0 0  Moving slowly or fidgety/restless 0 0 0 0 0  Suicidal thoughts 0 0 0 0 0  PHQ-9 Score 5 4 2 2 2   Difficult doing work/chores Not difficult at all Somewhat difficult Not difficult at all Not difficult at all Not difficult at all    Cholesterol Lab Results  Component Value Date   CHOL 213 (H) 05/15/2023   CHOL 162 08/23/2018   CHOL 191 05/03/2016   Lab Results  Component Value Date   HDL 65.10 05/15/2023   HDL 58.00 08/23/2018   HDL 68.30 05/03/2016   Lab Results  Component Value Date   LDLCALC 135 (H) 05/15/2023   LDLCALC 86 08/23/2018   LDLCALC 111 (H) 05/03/2016   Lab Results  Component Value Date   TRIG 63.0 05/15/2023   TRIG 92.0 08/23/2018   TRIG 57.0 05/03/2016   Lab Results  Component Value Date   CHOLHDL 3 05/15/2023  CHOLHDL 3 08/23/2018   CHOLHDL 3 05/03/2016   No results found for: "LDLDIRECT"  Diet is not optimal  More fast food at work- now making a change   Other labs Lab Results  Component Value Date   NA 139 05/15/2023   K 4.6 05/15/2023   CO2 26 05/15/2023   GLUCOSE 96 05/15/2023   BUN 18 05/15/2023   CREATININE 0.55 05/15/2023   CALCIUM 9.5 05/15/2023   GFR 125.07 05/15/2023   Lab Results  Component Value Date   WBC 6.1 05/15/2023   HGB 14.0 05/15/2023   HCT 43.4 05/15/2023   MCV 89.8 05/15/2023   PLT 247.0 05/15/2023   Lab Results  Component Value Date   TSH 1.56 05/15/2023       Patient Active Problem List   Diagnosis Date Noted   Mild hyperlipidemia 05/22/2023   Light headed 05/16/2023   Tension headache 05/16/2023   Family history of malignant melanoma of skin 02/21/2022   Smoking 05/13/2016   Encounter for routine gynecological examination 05/12/2016   Routine general medical examination at a health care facility 04/30/2016   Allergic rhinitis 01/21/2008   History reviewed. No pertinent past medical  history. Past Surgical History:  Procedure Laterality Date   WISDOM TOOTH EXTRACTION     Social History   Tobacco Use   Smoking status: Every Day    Current packs/day: 0.75    Types: Cigarettes   Smokeless tobacco: Never  Vaping Use   Vaping status: Never Used  Substance Use Topics   Alcohol use: Yes    Alcohol/week: 0.0 standard drinks of alcohol    Comment: occ   Drug use: No   History reviewed. No pertinent family history. No Known Allergies Current Outpatient Medications on File Prior to Visit  Medication Sig Dispense Refill   cetirizine (ZYRTEC) 10 MG tablet Take 10 mg by mouth daily as needed for allergies.     famotidine (PEPCID) 20 MG tablet Take 20 mg by mouth daily as needed.     fluticasone (FLONASE) 50 MCG/ACT nasal spray Place 2 sprays into both nostrils daily. 16 g 6   No current facility-administered medications on file prior to visit.    Review of Systems  Constitutional:  Negative for activity change, appetite change, fatigue, fever and unexpected weight change.  HENT:  Negative for congestion, ear pain, rhinorrhea, sinus pressure and sore throat.   Eyes:  Positive for visual disturbance. Negative for pain, discharge, redness and itching.  Respiratory:  Negative for cough, shortness of breath and wheezing.   Cardiovascular:  Negative for chest pain and palpitations.  Gastrointestinal:  Negative for abdominal pain, blood in stool, constipation and diarrhea.  Endocrine: Negative for polydipsia and polyuria.  Genitourinary:  Negative for dysuria, frequency and urgency.  Musculoskeletal:  Negative for arthralgias, back pain and myalgias.  Skin:  Negative for pallor and rash.  Allergic/Immunologic: Negative for environmental allergies.  Neurological:  Positive for light-headedness and headaches. Negative for dizziness, seizures, syncope, facial asymmetry and speech difficulty.  Hematological:  Negative for adenopathy. Does not bruise/bleed easily.   Psychiatric/Behavioral:  Negative for decreased concentration and dysphoric mood. The patient is not nervous/anxious.        Some stressors        Objective:   Physical Exam Constitutional:      General: She is not in acute distress.    Appearance: Normal appearance. She is well-developed and normal weight. She is not ill-appearing or diaphoretic.  HENT:  Head: Normocephalic and atraumatic.     Right Ear: Tympanic membrane, ear canal and external ear normal.     Left Ear: Tympanic membrane, ear canal and external ear normal.     Nose: Nose normal. No congestion.     Mouth/Throat:     Mouth: Mucous membranes are moist.     Pharynx: Oropharynx is clear. No posterior oropharyngeal erythema.  Eyes:     General: No scleral icterus.    Extraocular Movements: Extraocular movements intact.     Conjunctiva/sclera: Conjunctivae normal.     Pupils: Pupils are equal, round, and reactive to light.  Neck:     Thyroid: No thyromegaly.     Vascular: No carotid bruit or JVD.  Cardiovascular:     Rate and Rhythm: Normal rate and regular rhythm.     Pulses: Normal pulses.     Heart sounds: Normal heart sounds.     No gallop.  Pulmonary:     Effort: Pulmonary effort is normal. No respiratory distress.     Breath sounds: Normal breath sounds. No wheezing.     Comments: Good air exch Chest:     Chest wall: No tenderness.  Abdominal:     General: Bowel sounds are normal. There is no distension or abdominal bruit.     Palpations: Abdomen is soft. There is no mass.     Tenderness: There is no abdominal tenderness.     Hernia: No hernia is present.  Genitourinary:    Comments: Breast exam: No mass, nodules, thickening, tenderness, bulging, retraction, inflamation, nipple discharge or skin changes noted.  No axillary or clavicular LA.     Musculoskeletal:        General: No tenderness. Normal range of motion.     Cervical back: Normal range of motion and neck supple. No rigidity. No muscular  tenderness.     Right lower leg: No edema.     Left lower leg: No edema.     Comments: No kyphosis   Lymphadenopathy:     Cervical: No cervical adenopathy.  Skin:    General: Skin is warm and dry.     Coloration: Skin is not pale.     Findings: No erythema or rash.     Comments: Solar lentigines diffusely   Neurological:     Mental Status: She is alert. Mental status is at baseline.     Cranial Nerves: No cranial nerve deficit.     Motor: No abnormal muscle tone.     Coordination: Coordination normal.     Gait: Gait normal.     Deep Tendon Reflexes: Reflexes are normal and symmetric. Reflexes normal.  Psychiatric:        Mood and Affect: Mood normal.        Cognition and Memory: Cognition and memory normal.           Assessment & Plan:   Problem List Items Addressed This Visit       Other   Smoking    Disc in detail risks of smoking and possible outcomes including copd, vascular/ heart disease, cancer , respiratory and sinus infections  Pt voices understanding She is almost ready to quit Currently 1/2 ppd  Will try 14 mcg nicotine patch when ready      Routine general medical examination at a health care facility - Primary    Reviewed health habits including diet and exercise and skin cancer prevention Reviewed appropriate screening tests for age  Also reviewed health mt list,  fam hx and immunization status , as well as social and family history   See HPI Labs reviewed and ordered Encouraged self breast exams  Ref to obgyn to get est and for pelvic exam and pap  Counseled re: smoking cessation  Utd derm care for skin cancer screening  Discussed bone health /vitamin D and exercise  PHQ score of 5 - working on coping skills       Mild hyperlipidemia    Disc goals for lipids and reasons to control them Rev last labs with pt Rev low sat fat diet in detail Handout given  Plans to keep changing diet        Light headed    Thinks this and headache are from  eye strain  Has eye exam planned       Family history of malignant melanoma of skin    Utd derm care/skin cancer screening  Using sun protection       Encounter for routine gynecological examination    Due for annual exam Interested in starting family soon Will ref to get est with obgyn  Encouraged to start pnv over the counter when she starts trying  Discussed need for smoking cessation also      Relevant Orders   Ambulatory referral to Obstetrics / Gynecology

## 2023-05-22 NOTE — Assessment & Plan Note (Signed)
Disc goals for lipids and reasons to control them Rev last labs with pt Rev low sat fat diet in detail Handout given  Plans to keep changing diet

## 2023-05-22 NOTE — Assessment & Plan Note (Signed)
Thinks this and headache are from eye strain  Has eye exam planned

## 2023-05-22 NOTE — Assessment & Plan Note (Signed)
Reviewed health habits including diet and exercise and skin cancer prevention Reviewed appropriate screening tests for age  Also reviewed health mt list, fam hx and immunization status , as well as social and family history   See HPI Labs reviewed and ordered Encouraged self breast exams  Ref to obgyn to get est and for pelvic exam and pap  Counseled re: smoking cessation  Utd derm care for skin cancer screening  Discussed bone health /vitamin D and exercise  PHQ score of 5 - working on coping skills

## 2023-05-23 ENCOUNTER — Encounter: Payer: Self-pay | Admitting: *Deleted

## 2023-06-27 ENCOUNTER — Ambulatory Visit
Admission: EM | Admit: 2023-06-27 | Discharge: 2023-06-27 | Disposition: A | Discharge status: Home / Self Care | Payer: 59 | Attending: Emergency Medicine | Admitting: Emergency Medicine

## 2023-06-27 DIAGNOSIS — R0789 Other chest pain: Secondary | ICD-10-CM | POA: Diagnosis not present

## 2023-06-27 DIAGNOSIS — M940 Chondrocostal junction syndrome [Tietze]: Secondary | ICD-10-CM

## 2023-06-27 DIAGNOSIS — R079 Chest pain, unspecified: Secondary | ICD-10-CM | POA: Insufficient documentation

## 2023-06-27 DIAGNOSIS — F17200 Nicotine dependence, unspecified, uncomplicated: Secondary | ICD-10-CM | POA: Insufficient documentation

## 2023-06-27 DIAGNOSIS — F430 Acute stress reaction: Secondary | ICD-10-CM | POA: Diagnosis not present

## 2023-06-27 NOTE — ED Triage Notes (Signed)
Patient to Urgent Care with complaints of chest pain and dizziness.  Symptoms started this morning at 5am. Woke up with what she thought was heart burn. Took tums with some relief.   Now describes dull, aching pain on the left side of her chest. Intermittent. At times feels it on the right. Denies any SHOB. No improvement with rest. Reports constant dizziness that worsens when standing walking.  No hx of the same.

## 2023-06-27 NOTE — ED Provider Notes (Signed)
Nicole Mcdaniel    CSN: [id number] Arrival date & time: 06/27/23  0827      History   Chief Complaint Chief Complaint  Patient presents with   Chest Pain   Dizziness    HPI Nicole Mcdaniel is a 27 y.o. female.   27 year old female pt, Nicole Mcdaniel, presents to ER with chief complaint of anterior chest wall pain that started this am at 0500. Pt has taken OTC tums with some relief. Pt rates pain as 3/10. Worse with palpation. Pt smokes at least 1/2 ppd. No family hx of cardiac. Pt states she is under a lot of stress, getting married next month. No illness exposure.   The history is provided by the patient. No language interpreter was used.    History reviewed. No pertinent past medical history.  Patient Active Problem List   Diagnosis Date Noted   Chest pain 06/27/2023   Stress reaction 06/27/2023   Costochondritis 06/27/2023   Mild hyperlipidemia 05/22/2023   Light headed 05/16/2023   Tension headache 05/16/2023   Family history of malignant melanoma of skin 02/21/2022   Smoker 05/13/2016   Encounter for routine gynecological examination 05/12/2016   Routine general medical examination at a health care facility 04/30/2016   Allergic rhinitis 01/21/2008    Past Surgical History:  Procedure Laterality Date   WISDOM TOOTH EXTRACTION      OB History   No obstetric history on file.      Home Medications    Prior to Admission medications   Medication Sig Start Date End Date Taking? Authorizing Provider  cetirizine (ZYRTEC) 10 MG tablet Take 10 mg by mouth daily as needed for allergies.    [provider]  famotidine (PEPCID) 20 MG tablet Take 20 mg by mouth daily as needed.    [provider]  fluticasone (FLONASE) 50 MCG/ACT nasal spray Place 2 sprays into both nostrils daily. 12/28/22   Tower, Audrie Gallus, MD    Family History History reviewed. No pertinent family history.  Social History Social History   Tobacco Use   Smoking status:  Every Day    Current packs/day: 0.75    Types: Cigarettes   Smokeless tobacco: Never  Vaping Use   Vaping status: Never Used  Substance Use Topics   Alcohol use: Yes    Alcohol/week: 0.0 standard drinks of alcohol    Comment: occ   Drug use: No     Allergies   Patient has no known allergies.   Review of Systems Review of Systems  Constitutional:  Negative for fever.  Respiratory:  Negative for cough.   Cardiovascular:  Positive for chest pain. Negative for palpitations and leg swelling.  Neurological:  Positive for light-headedness.  All other systems reviewed and are negative.    Physical Exam Triage Vital Signs ED Triage Vitals  Encounter Vitals Group     BP 06/27/23 0839 126/83     Systolic BP Percentile --      Diastolic BP Percentile --      Pulse Rate 06/27/23 0839 78     Resp 06/27/23 0839 18     Temp 06/27/23 0839 98.3 F (36.8 C)     Temp src --      SpO2 06/27/23 0839 98 %     Weight 06/27/23 0838 145 lb (65.8 kg)     Height 06/27/23 0838 5' 5.5" (1.664 m)     Head Circumference --      Peak Flow --  Pain Score 06/27/23 0833 2     Pain Loc --      Pain Education --      Exclude from Growth Chart --    No data found.  Updated Vital Signs BP 126/83   Pulse 78   Temp 98.3 F (36.8 C)   Resp 18   Ht 5' 5.5" (1.664 m)   Wt 145 lb (65.8 kg)   LMP 06/20/2023   SpO2 98%   BMI 23.76 kg/m   Visual Acuity Right Eye Distance:   Left Eye Distance:   Bilateral Distance:    Right Eye Near:   Left Eye Near:    Bilateral Near:     Physical Exam Vitals and nursing note reviewed.  Constitutional:      Appearance: Normal appearance. She is well-developed and well-groomed.  HENT:     Head: Normocephalic.  Eyes:     General: Lids are normal.     Pupils: Pupils are equal, round, and reactive to light.  Cardiovascular:     Rate and Rhythm: Normal rate and regular rhythm.     Pulses: Normal pulses.     Heart sounds: Normal heart sounds.   Pulmonary:     Effort: Pulmonary effort is normal.     Breath sounds: Normal breath sounds and air entry.  Chest:     Chest wall: Tenderness present.     Comments: +TTP, pain reproducible. Neurological:     General: No focal deficit present.     Mental Status: She is alert and oriented to person, place, and time.     GCS: GCS eye subscore is 4. GCS verbal subscore is 5. GCS motor subscore is 6.     Cranial Nerves: No cranial nerve deficit.     Sensory: No sensory deficit.  Psychiatric:        Attention and Perception: Attention normal.        Mood and Affect: Mood is anxious.        Speech: Speech normal.        Behavior: Behavior normal. Behavior is cooperative.      UC Treatments / Results  Labs (all labs ordered are listed, but only abnormal results are displayed) Labs Reviewed - No data to display  EKG   Radiology No results found.  Procedures Procedures (including critical care time)  Medications Ordered in UC Medications - No data to display  Initial Impression / Assessment and Plan / UC Course  I have reviewed the triage vital signs and the nursing notes.  Pertinent labs & imaging results that were available during my care of the patient were reviewed by me and considered in my medical decision making (see chart for details).  Clinical Course as of 06/27/23 0907  Wed Jun 27, 2023  0844 EKG Shows NSR rate 86, No STEMI, QTC 421 [JD]    Clinical Course User Index [JD] Hersel Mcmeen, Para March, NP    Ddx: Costochondritis, stress reaction,atypical chest pain, PE Final Clinical Impressions(s) / UC Diagnoses   Final diagnoses:  Other chest pain  Stress reaction  Costochondritis  Smoker     Discharge Instructions      Please continue your home meds, follow-up with PCP.  Take NSAIDS for chest wall pain as discussed. Your EKG appears normal in office at present, recommend follow-up with emergency room if you continue to have chest pain or if you develop  shortness of breath, palpitations, dyspnea on exertion, worsening issues. Avoid spicy,greasy,fried foods. Stop smoking.  ED Prescriptions   None    PDMP not reviewed this encounter.   Clancy Gourd, NP 06/27/23 [phone]

## 2023-06-27 NOTE — Discharge Instructions (Addendum)
Please continue your home meds, follow-up with PCP.  Take NSAIDS for chest wall pain as discussed. Your EKG appears normal in office at present, recommend follow-up with emergency room if you continue to have chest pain or if you develop shortness of breath, palpitations, dyspnea on exertion, worsening issues. Avoid spicy,greasy,fried foods. Stop smoking.

## 2023-07-04 ENCOUNTER — Ambulatory Visit: Payer: 59 | Admitting: Family Medicine

## 2023-07-04 ENCOUNTER — Encounter: Payer: Self-pay | Admitting: Family Medicine

## 2023-07-04 VITALS — BP 110/60 | HR 75 | Temp 98.0°F | Resp 16 | Ht 65.0 in | Wt 144.5 lb

## 2023-07-04 DIAGNOSIS — M546 Pain in thoracic spine: Secondary | ICD-10-CM | POA: Diagnosis not present

## 2023-07-04 DIAGNOSIS — R0781 Pleurodynia: Secondary | ICD-10-CM | POA: Insufficient documentation

## 2023-07-04 DIAGNOSIS — R079 Chest pain, unspecified: Secondary | ICD-10-CM

## 2023-07-04 DIAGNOSIS — M940 Chondrocostal junction syndrome [Tietze]: Secondary | ICD-10-CM

## 2023-07-04 DIAGNOSIS — M549 Dorsalgia, unspecified: Secondary | ICD-10-CM | POA: Insufficient documentation

## 2023-07-04 NOTE — Assessment & Plan Note (Signed)
Reviewed ER records Reviewed hospital records, lab results and studies in detail   Per pt resolved

## 2023-07-04 NOTE — Patient Instructions (Signed)
Call when you can come in for xray to make sure we have staff and are able to do xrays  Between 8 and 4:30  I want to xray your chest and mid back   Use some heat to relax muscles if you need to   Try stretching   Physical therapy may help in the future

## 2023-07-04 NOTE — Assessment & Plan Note (Signed)
Rev ER notes Reviewed hospital records, lab results and studies in detail   Per pt resolved

## 2023-07-04 NOTE — Assessment & Plan Note (Signed)
This pain seems to wrap around from TS (where it starts) Reassuring exam/ not tender in chest  Cxr planned-will return when xr staff are here (decliened another imaging center) Smoker (but stopped this weekend0   Reviewed recent ER visit for cp and ? Costochondritis Per pt those symptoms are unrelated and resolved

## 2023-07-04 NOTE — Progress Notes (Signed)
Subjective:    Patient ID: Nicole Mcdaniel, female    DOB: 19-May-1996, 27 y.o.   MRN: [id number]  HPI  Wt Readings from Last 3 Encounters:  07/04/23 144 lb 8 oz (65.5 kg)  06/27/23 145 lb (65.8 kg)  05/22/23 149 lb (67.6 kg)   24.05 kg/m  Vitals:   07/04/23 1116  BP: 110/60  Pulse: 75  Resp: 16  Temp: 98 F (36.7 C)  SpO2: 98%    Pt presents with c/o rib and back pain    Pt was seen in ER for this on 9/4 for chest pain  C/o chest pain  Over the counter tums gave some relief  Some soreness to palpation   Smoker  No cigarettes since sat night!  Doing fairly well so far  Intends to quit totally    EKG NSR rate of 86  Dx with likely costochondritis Was instructed to try nsaids   Now chest pain is better Feels pain in ribs   Feels a little like gas   Feels muscular   Fleeting  Sharp in nature Not bad enough to stop what she is doing  Both sides- wraps around ot the front  No skin change   Sometimes stretching improves it a bit  She is a stomach and side sleepwer  If she sleeps propped up it helps (with a special pillow)  Sometimes wakes up with sore and stiff back  Tends to have a lot of back pain in general   Once had a sharp pain in lower back   No history of scoliosis  Chiropractor in past for various things- sometimes helps but not always   Not worse with deep breath or cough or sneeze  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 No pertinent past medical history. Past Surgical History:  Procedure Laterality Date   WISDOM TOOTH EXTRACTION     Social History   Tobacco Use   Smoking status: Every Day    Current packs/day: 0.75    Types: Cigarettes   Smokeless tobacco: Never  Vaping Use   Vaping status: Never Used  Substance Use Topics   Alcohol use: Yes    Alcohol/week: 0.0 standard drinks of alcohol    Comment: occ   Drug use: No   History reviewed. No pertinent family history. No Known Allergies Current Outpatient Medications on File Prior to Visit  Medication Sig Dispense Refill   cetirizine (ZYRTEC) 10 MG tablet Take 10 mg by mouth daily as needed for allergies.     famotidine (PEPCID) 20 MG tablet Take 20 mg by mouth daily as needed.     fluticasone (FLONASE) 50 MCG/ACT nasal spray Place 2 sprays into both nostrils daily. 16 g 6   No current facility-administered medications on file prior to visit.    Review of Systems  Constitutional:  Negative for activity change, appetite change, fatigue, fever and  unexpected weight change.  HENT:  Negative for congestion, ear pain, rhinorrhea, sinus pressure and sore throat.   Eyes:  Negative for pain, redness and visual disturbance.  Respiratory:  Negative for cough, shortness of breath and wheezing.   Cardiovascular:  Negative for chest pain and palpitations.  Gastrointestinal:  Negative for abdominal pain, blood in stool, constipation and diarrhea.  Endocrine: Positive for polyuria. Negative for polydipsia.  Genitourinary:  Negative for dysuria, frequency and urgency.  Musculoskeletal:  Positive for back pain and myalgias. Negative for arthralgias.  Skin:  Negative for pallor and rash.  Allergic/Immunologic: Negative for environmental allergies.  Neurological:  Negative for dizziness, syncope, light-headedness and headaches.  Hematological:  Negative for adenopathy. Does not bruise/bleed easily.  Psychiatric/Behavioral:  Negative  for decreased concentration and dysphoric mood. The patient is not nervous/anxious.        Objective:   Physical Exam Constitutional:      General: She is not in acute distress.    Appearance: Normal appearance. She is well-developed and normal weight. She is not ill-appearing or diaphoretic.  HENT:     Head: Normocephalic and atraumatic.  Eyes:     General: No scleral icterus.    Conjunctiva/sclera: Conjunctivae normal.     Pupils: Pupils are equal, round, and reactive to light.  Cardiovascular:     Rate and Rhythm: Normal rate and regular rhythm.  Pulmonary:     Effort: Pulmonary effort is normal.     Breath sounds: Normal breath sounds. No wheezing or rales.  Abdominal:     General: Bowel sounds are normal. There is no distension.     Palpations: Abdomen is soft.     Tenderness: There is no abdominal tenderness.  Musculoskeletal:        General: Tenderness present. No swelling or deformity.     Cervical back: Normal range of motion and neck supple.     Thoracic back: Tenderness present. No deformity, signs of trauma or bony tenderness. Normal range of motion. No scoliosis.     Lumbar back: Spasms and tenderness present. No swelling, edema, deformity or bony tenderness. Normal range of motion. Negative right straight leg raise test and negative left straight leg raise test. No scoliosis.     Right lower leg: No edema.     Left lower leg: No edema.     Comments: Pain with extension in upper right lumbar musculature and right throacic musculature  Also with left lateral bend and upper body twist   No rib tenderness No sternal tenderness   Lymphadenopathy:     Cervical: No cervical adenopathy.  Skin:    General: Skin is warm and dry.     Coloration: Skin is not pale.     Findings: No erythema or rash.  Neurological:     Mental Status: She is alert.     Cranial Nerves: No cranial nerve deficit.     Sensory: No sensory deficit.     Motor: No atrophy or abnormal muscle tone.      Coordination: Coordination normal.     Deep Tendon Reflexes: Reflexes are normal and symmetric.     Comments: Negative SLR           Assessment & Plan:   Problem List Items Addressed This Visit       Musculoskeletal and Integument   RESOLVED: Costochondritis    Reviewed ER records Reviewed hospital records, lab results and studies in detail   Per pt resolved  Other   Back pain - Primary    Soreness in thoracic area- muscular No spinal tenderness Taking nsaid prn  Advised use of heat and stretches Given rehab handout and one on thoracic strain   Will plan xray when our xray staff return  She declines imaging outside of the office       Relevant Orders   DG Chest 2 View   DG Thoracic Spine 2 View   RESOLVED: Chest pain    Rev ER notes Reviewed hospital records, lab results and studies in detail   Per pt resolved       Rib pain    This pain seems to wrap around from TS (where it starts) Reassuring exam/ not tender in chest  Cxr planned-will return when xr staff are here (decliened another imaging center) Smoker (but stopped this weekend0   Reviewed recent ER visit for cp and ? Costochondritis Per pt those symptoms are unrelated and resolved       Relevant Orders   DG Chest 2 View   DG Thoracic Spine 2 View

## 2023-07-04 NOTE — Assessment & Plan Note (Signed)
Soreness in thoracic area- muscular No spinal tenderness Taking nsaid prn  Advised use of heat and stretches Given rehab handout and one on thoracic strain   Will plan xray when our xray staff return  She declines imaging outside of the office

## 2023-08-20 ENCOUNTER — Ambulatory Visit (INDEPENDENT_AMBULATORY_CARE_PROVIDER_SITE_OTHER)
Admission: RE | Admit: 2023-08-20 | Discharge: 2023-08-20 | Disposition: A | Discharge status: Home / Self Care | Payer: 59 | Source: Ambulatory Visit | Attending: Family Medicine | Admitting: Family Medicine

## 2023-08-20 DIAGNOSIS — M4187 Other forms of scoliosis, lumbosacral region: Secondary | ICD-10-CM | POA: Diagnosis not present

## 2023-08-20 DIAGNOSIS — R0781 Pleurodynia: Secondary | ICD-10-CM | POA: Diagnosis not present

## 2023-08-20 DIAGNOSIS — M546 Pain in thoracic spine: Secondary | ICD-10-CM | POA: Diagnosis not present

## 2023-08-20 DIAGNOSIS — M438X4 Other specified deforming dorsopathies, thoracic region: Secondary | ICD-10-CM | POA: Diagnosis not present

## 2023-09-17 ENCOUNTER — Encounter: Payer: Self-pay | Admitting: Family Medicine

## 2023-09-17 DIAGNOSIS — M546 Pain in thoracic spine: Secondary | ICD-10-CM

## 2023-09-17 DIAGNOSIS — R0781 Pleurodynia: Secondary | ICD-10-CM

## 2023-10-24 NOTE — L&D Delivery Note (Signed)
 Delivery Note At 5:10 PM a viable female was delivered via Vaginal, Spontaneous (Presentation: Right Occiput Anterior).  APGAR: , ; weight 6 lb 7.4 oz (2930 g).   Placenta status: Manual removal, Intact;Adherent.  Cord: 3 vessels with the following complications: None.   The placenta was not detached with expectant management for 30 minutes.  Some cord traction was applied, however the cord was thin and avulsed easily with additional traction.  Pitocin  was started, then stopped to assess for improvement.  Manual extraction was attempted with use of IV fentanyl  (patient without epidural).  This was challenging due to uterine contraction and cervical contraction.  Anesthesia requested for assistance with nitroglycerin to uterine/cervical relaxation.  This was helpful in facilitated manual extraction, but still challenging. Following removal, a bedside ultrasound was reassuring in that all significant fragments were removed. An additional sweep was done and some clot was removed. Endometrium was smooth, thus I suspect trapped placenta vs. PAS.   Plan for 2g ancef  for multiple times entering cavity manually.    Anesthesia:  Local, IV fentanyl  Episiotomy: None Lacerations: 2nd degree;Perineal Suture Repair: 2.0 vicryl rapide Est. Blood Loss (mL): 500 cc EBL   Mom to postpartum.  Baby to Couplet care / Skin to Skin.  Evalene DELENA Smiles 06/17/2024, 7:10 PM

## 2023-10-30 ENCOUNTER — Ambulatory Visit: Payer: 59 | Admitting: Family Medicine

## 2023-10-30 ENCOUNTER — Encounter: Payer: Self-pay | Admitting: Family Medicine

## 2023-10-30 VITALS — BP 128/78 | HR 101 | Temp 98.3°F | Ht 65.0 in | Wt 156.1 lb

## 2023-10-30 DIAGNOSIS — J069 Acute upper respiratory infection, unspecified: Secondary | ICD-10-CM | POA: Diagnosis not present

## 2023-10-30 DIAGNOSIS — Z3201 Encounter for pregnancy test, result positive: Secondary | ICD-10-CM

## 2023-10-30 DIAGNOSIS — N926 Irregular menstruation, unspecified: Secondary | ICD-10-CM

## 2023-10-30 LAB — POCT URINE PREGNANCY: Preg Test, Ur: POSITIVE — AB

## 2023-10-30 NOTE — Patient Instructions (Addendum)
 Book  What to expect when you are expecting   Keep trying to quit smoking -don't buy another pack   Your husband has to quit also   Stay on prenatal vitamins   Keep your obgyn appointment   Increase fluids / sip all day long  Aim for at least 64 oz per day   Use a humidifier in your bedroom -move it closer to heat vent  Keep it very clean   Dilute non caffeine tea with honey and lemon are good for respiratory symptoms If pain or fever-tylenol   Lozenges are ok    If symptoms worsen - give us  a call

## 2023-10-30 NOTE — Assessment & Plan Note (Addendum)
 EDC 4 weeks / 2 days  Very early  Reliable LMP of 09/30/23  Positive test today and several at home   Antic guidance given  Suggested what to expect book  Avoiding etoh and caffeine  Has almost completely quit smoking Taking pnv daily over the counter   Encouraged regular fluids and diet without skipping meals   Discussed possible of early loss if she has significant bleeding   Has appointment with obgyn scheduled/ Dr Mat Roy given today

## 2023-10-30 NOTE — Assessment & Plan Note (Signed)
 In context of very early pregnancy   Wants to avoid medication as much as possible  Discussed use of nasal saline spray / vaporizer / fluids kemper and lemon Tylenol  if needed for pain or fever  Update if not starting to improve in a week or if worsening  Call back and Er precautions noted in detail today

## 2023-10-30 NOTE — Progress Notes (Signed)
 Subjective:    Patient ID: Nicole Mcdaniel, female    DOB: 1996/06/10, 28 y.o.   MRN: [id number]  HPI  Wt Readings from Last 3 Encounters:  10/30/23 156 lb 2 oz (70.8 kg)  07/04/23 144 lb 8 oz (65.5 kg)  06/27/23 145 lb (65.8 kg)   25.98 kg/m  Vitals:   10/30/23 1021  BP: 128/78  Pulse: (!) 101  Temp: 98.3 F (36.8 C)  SpO2: 97%    Pt presents for c/o nasal congestion  Possible pregnancy (multiple pregnancy tests positive at home)     Symptoms started last Monday (a week ago) A little stuffy nose and scratchy throat  Tuesday- congestion  Now waking up with dry nose and throat  Some pnd  No fever  No sinus pain No coughing     Over the counter  Nothing right now    Smoking status  Has cut down -last pack lasted 3-4 days Feels jittery    LMP: 09/30/23  Very early  Is excited about new pregnancy First time   Had some light bleeding on Thursday  Light spotting on Sunday Had a little cramping   Some fatigue  Slight nausea if any   [facility]   Stopped alcohol entirely   Works in [employer] office   Results for orders placed or performed in visit on 10/30/23  POCT urine pregnancy   Collection Time: 10/30/23 10:31 AM  Result Value Ref Range   Preg Test, Ur Positive (A) Negative     Pap 08/2018    Patient Active Problem List   Diagnosis Date Noted   Positive pregnancy test 10/30/2023   Viral URI 10/30/2023   Back pain 07/04/2023   Rib pain 07/04/2023   Stress reaction 06/27/2023   Mild hyperlipidemia 05/22/2023   Light headed 05/16/2023   Tension headache 05/16/2023   Family history of malignant melanoma of skin 02/21/2022   Smoker 05/13/2016   Encounter for routine gynecological examination 05/12/2016   Routine general medical examination at a health care facility 04/30/2016   Allergic rhinitis 01/21/2008   History reviewed. No pertinent past medical history. Past Surgical History:  Procedure  Laterality Date   WISDOM TOOTH EXTRACTION     Social History   Tobacco Use   Smoking status: Every Day    Current packs/day: 0.75    Types: Cigarettes   Smokeless tobacco: Never  Vaping Use   Vaping status: Never Used  Substance Use Topics   Alcohol use: Yes    Alcohol/week: 0.0 standard drinks of alcohol    Comment: occ   Drug use: No   History reviewed. No pertinent family history. No Known Allergies Current Outpatient Medications on File Prior to Visit  Medication Sig Dispense Refill   Prenatal Vit-Fe Fumarate-FA (PRENATAL VITAMIN PO) Take 1 tablet by mouth daily.     No current facility-administered medications on file prior to visit.    Review of Systems  Constitutional:  Positive for fatigue. Negative for appetite change and fever.  HENT:  Positive for congestion, postnasal drip, rhinorrhea, sinus pressure, sneezing and sore throat. Negative for ear pain.   Eyes:  Negative for pain and discharge.  Respiratory:  Positive for cough. Negative for shortness of breath, wheezing and stridor.   Cardiovascular:  Negative for chest pain.  Gastrointestinal:  Negative for diarrhea, nausea and vomiting.  Genitourinary:  Negative for frequency, hematuria and urgency.  Musculoskeletal:  Negative for arthralgias and myalgias.  Skin:  Negative for rash.  Neurological:  Positive for headaches. Negative for dizziness, weakness and light-headedness.  Psychiatric/Behavioral:  Negative for confusion and dysphoric mood.        Objective:   Physical Exam Constitutional:      General: She is not in acute distress.    Appearance: Normal appearance. She is well-developed and normal weight. She is not ill-appearing, toxic-appearing or diaphoretic.  HENT:     Head: Normocephalic and atraumatic.     Comments: Nares are injected and congested      Right Ear: Tympanic membrane, ear canal and external ear normal.     Left Ear: Tympanic membrane, ear canal and external ear normal.     Nose:  Congestion and rhinorrhea present.     Mouth/Throat:     Mouth: Mucous membranes are moist.     Pharynx: Oropharynx is clear. No oropharyngeal exudate or posterior oropharyngeal erythema.     Comments: Clear pnd  Eyes:     General:        Right eye: No discharge.        Left eye: No discharge.     Conjunctiva/sclera: Conjunctivae normal.     Pupils: Pupils are equal, round, and reactive to light.  Cardiovascular:     Rate and Rhythm: Normal rate.     Heart sounds: Normal heart sounds.  Pulmonary:     Effort: Pulmonary effort is normal. No respiratory distress.     Breath sounds: Normal breath sounds. No stridor. No wheezing, rhonchi or rales.     Comments: Good air exch Chest:     Chest wall: No tenderness.  Abdominal:     General: There is no distension.     Palpations: There is no mass.     Tenderness: There is no abdominal tenderness.     Comments: Unable to palp fundus above pelvic bone   Musculoskeletal:     Cervical back: Normal range of motion and neck supple.     Right lower leg: No edema.     Left lower leg: No edema.  Lymphadenopathy:     Cervical: No cervical adenopathy.  Skin:    General: Skin is warm and dry.     Capillary Refill: Capillary refill takes less than 2 seconds.     Findings: No rash.  Neurological:     Mental Status: She is alert.     Cranial Nerves: No cranial nerve deficit.  Psychiatric:        Mood and Affect: Mood normal.           Assessment & Plan:   Problem List Items Addressed This Visit       Respiratory   Viral URI   In context of very early pregnancy   Wants to avoid medication as much as possible  Discussed use of nasal saline spray / vaporizer / fluids /honey and lemon Tylenol  if needed for pain or fever  Update if not starting to improve in a week or if worsening  Call back and Er precautions noted in detail today          Other   Positive pregnancy test - Primary   EDC 4 weeks / 2 days  Very early  Reliable  LMP of 09/30/23  Positive test today and several at home   Antic guidance given  Suggested what to expect book  Avoiding etoh and caffeine  Has almost completely quit smoking Taking pnv daily over the counter   Encouraged  regular fluids and diet without skipping meals   Discussed possible of early loss if she has significant bleeding   Has appointment with obgyn scheduled/ Dr Mat Roy given today       Other Visit Diagnoses       Missed period       Relevant Orders   POCT urine pregnancy (Completed)

## 2023-11-04 ENCOUNTER — Emergency Department: Payer: 59

## 2023-11-04 ENCOUNTER — Emergency Department
Admission: EM | Admit: 2023-11-04 | Discharge: 2023-11-04 | Disposition: A | Discharge status: Home / Self Care | Payer: 59 | Attending: Emergency Medicine | Admitting: Emergency Medicine

## 2023-11-04 DIAGNOSIS — O209 Hemorrhage in early pregnancy, unspecified: Secondary | ICD-10-CM | POA: Diagnosis not present

## 2023-11-04 DIAGNOSIS — Z3491 Encounter for supervision of normal pregnancy, unspecified, first trimester: Secondary | ICD-10-CM

## 2023-11-04 DIAGNOSIS — O200 Threatened abortion: Secondary | ICD-10-CM | POA: Insufficient documentation

## 2023-11-04 DIAGNOSIS — Z3A01 Less than 8 weeks gestation of pregnancy: Secondary | ICD-10-CM | POA: Insufficient documentation

## 2023-11-04 LAB — CBC
HCT: 43.9 % (ref 36.0–46.0)
Hemoglobin: 14.8 g/dL (ref 12.0–15.0)
MCH: 29.2 pg (ref 26.0–34.0)
MCHC: 33.7 g/dL (ref 30.0–36.0)
MCV: 86.6 fL (ref 80.0–100.0)
Platelets: 293 10*3/uL (ref 150–400)
RBC: 5.07 MIL/uL (ref 3.87–5.11)
RDW: 12.6 % (ref 11.5–15.5)
WBC: 11.7 10*3/uL — ABNORMAL HIGH (ref 4.0–10.5)
nRBC: 0 % (ref 0.0–0.2)

## 2023-11-04 LAB — URINALYSIS, ROUTINE W REFLEX MICROSCOPIC
Bilirubin Urine: NEGATIVE
Glucose, UA: NEGATIVE mg/dL
Ketones, ur: 20 mg/dL — AB
Leukocytes,Ua: NEGATIVE
Nitrite: NEGATIVE
Protein, ur: NEGATIVE mg/dL
RBC / HPF: 0 RBC/hpf (ref 0–5)
Specific Gravity, Urine: 1.002 — ABNORMAL LOW (ref 1.005–1.030)
pH: 7 (ref 5.0–8.0)

## 2023-11-04 LAB — BASIC METABOLIC PANEL
Anion gap: 15 (ref 5–15)
BUN: 8 mg/dL (ref 6–20)
CO2: 20 mmol/L — ABNORMAL LOW (ref 22–32)
Calcium: 9.7 mg/dL (ref 8.9–10.3)
Chloride: 101 mmol/L (ref 98–111)
Creatinine, Ser: 0.47 mg/dL (ref 0.44–1.00)
GFR, Estimated: >60 mL/min (ref >60)
Glucose, Bld: 89 mg/dL (ref 70–99)
Potassium: 3.5 mmol/L (ref 3.5–5.1)
Sodium: 136 mmol/L (ref 135–145)

## 2023-11-04 LAB — HCG, QUANTITATIVE, PREGNANCY: hCG, Beta Chain, Quant, S: 12597 m[IU]/mL — ABNORMAL HIGH (ref <5)

## 2023-11-04 LAB — TYPE AND SCREEN
ABO/RH(D): O POS
Antibody Screen: NEGATIVE

## 2023-11-04 LAB — POC URINE PREG, ED: Preg Test, Ur: POSITIVE — AB

## 2023-11-04 NOTE — ED Provider Triage Note (Signed)
 Emergency Medicine Provider Triage Evaluation Note  Nicole Mcdaniel , a 27 y.o. female  was evaluated in triage.  Pt complains of vaginal bleeding x 2 days that has since increased. Pt is [redacted] weeks pregnant. Endorse lower abdominal cramping  Review of Systems  Positive:  Negative:   Physical Exam  BP (!) 140/86 (BP Location: Right Arm)   Pulse 100   Temp 98.3 F (36.8 C) (Oral)   Resp 18   Ht 5' 5 (1.651 m)   Wt 70.8 kg   LMP 09/30/2023   SpO2 96%   BMI 25.96 kg/m  Gen:   Awake, no distress   Resp:  Normal effort  MSK:   Moves extremities without difficulty  Other:    Medical Decision Making  Medically screening exam initiated at 5:51 PM.  Appropriate orders placed.  Nicole Mcdaniel was informed that the remainder of the evaluation will be completed by another provider, this initial triage assessment does not replace that evaluation, and the importance of remaining in the ED until their evaluation is complete.     Nicole Mcdaniel, Cleo Villamizar A, PA-C 11/04/23 1752

## 2023-11-04 NOTE — ED Triage Notes (Signed)
 Pt c/o increasing vaginal bleeding x2 days and lower abdominal cramping and nausea starting today.  Pain score 2/10.  Pt reports bleeding is dark red.  Pt had a positive home pregnancy test and positive urine at PCP.  Pt estimates herself to be [redacted] weeks pregnant.

## 2023-11-04 NOTE — Discharge Instructions (Addendum)
 Your ultrasound today is reassuring, demonstrating that your pregnancy is normally located within your uterus and at expected size for this stage of pregnancy.  Your pregnancy hormone level (beta-HCG) today is 12,600. Your blood type is O-positive.  Continue prenatal vitamins and follow up with your obstetrics office as planned, or seek care sooner if symptoms worsen.

## 2023-11-04 NOTE — ED Provider Notes (Signed)
 [facility] Provider Note    Event Date/Time   First MD Initiated Contact with Patient 11/04/23 1853     (approximate)   History   Chief Complaint: Vaginal Bleeding, Abdominal Pain, and Nausea   HPI  Nicole Mcdaniel is a 28 y.o. female G1, P0 at [redacted] weeks pregnant who comes ED complaining of vaginal bleeding characterized by spotting for the last 2 days.  Also has some mild lower abdominal cramping and nausea.  No fever, no dysuria, no abnormal vaginal discharge.  Plans to see the prenatal care in [place].  Taking prenatal vitamins.          Physical Exam   Triage Vital Signs: ED Triage Vitals  Encounter Vitals Group     BP 11/04/23 1747 (!) 140/86     Systolic BP Percentile --      Diastolic BP Percentile --      Pulse Rate 11/04/23 1747 100     Resp 11/04/23 1747 18     Temp 11/04/23 1747 98.3 F (36.8 C)     Temp Source 11/04/23 1747 Oral     SpO2 11/04/23 1747 96 %     Weight 11/04/23 1748 156 lb (70.8 kg)     Height 11/04/23 1748 5' 5 (1.651 m)     Head Circumference --      Peak Flow --      Pain Score 11/04/23 1747 2     Pain Loc --      Pain Education --      Exclude from Growth Chart --     Most recent vital signs: Vitals:   11/04/23 1747  BP: (!) 140/86  Pulse: 100  Resp: 18  Temp: 98.3 F (36.8 C)  SpO2: 96%    General: Awake, no distress.  CV:  Good peripheral perfusion.  Resp:  Normal effort.  Abd:  No distention.  Soft, nontender    ED Results / Procedures / Treatments   Labs (all labs ordered are listed, but only abnormal results are displayed) Labs Reviewed  HCG, QUANTITATIVE, PREGNANCY - Abnormal; Notable for the following components:      Result Value   hCG, Beta Chain, Quant, S 12,597 (*)    All other components within normal limits  URINALYSIS, ROUTINE W REFLEX MICROSCOPIC - Abnormal; Notable for the following components:   Color, Urine COLORLESS (*)    APPearance CLEAR (*)    Specific  Gravity, Urine 1.002 (*)    Hgb urine dipstick MODERATE (*)    Ketones, ur 20 (*)    Bacteria, UA RARE (*)    All other components within normal limits  CBC - Abnormal; Notable for the following components:   WBC 11.7 (*)    All other components within normal limits  BASIC METABOLIC PANEL - Abnormal; Notable for the following components:   CO2 20 (*)    All other components within normal limits  POC URINE PREG, ED - Abnormal; Notable for the following components:   Preg Test, Ur POSITIVE (*)    All other components within normal limits  TYPE AND SCREEN  ABO/RH     EKG    RADIOLOGY Ultrasound pelvis interpreted by me, shows gestational sac and yolk sac within the endometrium, size consistent with dates.  Radiology report reviewed   PROCEDURES:  Procedures   MEDICATIONS ORDERED IN ED: Medications - No data to display   IMPRESSION / MDM / ASSESSMENT AND PLAN / ED COURSE  I reviewed the triage vital signs and the nursing notes.  DDx: Threatened miscarriage, ectopic pregnancy, UTI, anemia  Patient's presentation is most consistent with acute presentation with potential threat to life or bodily function.  Patient presents with vaginal spotting at [redacted] weeks pregnant.  Vital signs are normal, she is nontoxic with reassuring exam.  Ultrasound obtained which is negative for ectopic pregnancy.  Counseled patient on threatened miscarriage, can follow-up with prenatal care as planned.       FINAL CLINICAL IMPRESSION(S) / ED DIAGNOSES   Final diagnoses:  First trimester pregnancy  Threatened miscarriage     Rx / DC Orders   ED Discharge Orders     None        Note:  This document was prepared using Dragon voice recognition software and may include unintentional dictation errors.   Viviann Pastor, MD 11/04/23 2248

## 2023-11-08 ENCOUNTER — Telehealth: Payer: Self-pay | Admitting: *Deleted

## 2023-11-08 NOTE — Progress Notes (Signed)
Transition Care Management Unsuccessful Follow-up Telephone Call  Date of discharge and from where:  [facility]  11/04/2023  Attempts:  1st Attempt  Reason for unsuccessful TCM follow-up call:  No answer/busy

## 2023-11-13 ENCOUNTER — Ambulatory Visit: Payer: 59 | Admitting: Family Medicine

## 2023-11-13 ENCOUNTER — Encounter: Payer: Self-pay | Admitting: Family Medicine

## 2023-11-13 VITALS — BP 124/76 | HR 76 | Temp 98.5°F | Ht 65.0 in | Wt 152.1 lb

## 2023-11-13 DIAGNOSIS — Z3A01 Less than 8 weeks gestation of pregnancy: Secondary | ICD-10-CM | POA: Diagnosis not present

## 2023-11-13 DIAGNOSIS — O26859 Spotting complicating pregnancy, unspecified trimester: Secondary | ICD-10-CM | POA: Diagnosis not present

## 2023-11-13 DIAGNOSIS — Z3490 Encounter for supervision of normal pregnancy, unspecified, unspecified trimester: Secondary | ICD-10-CM

## 2023-11-13 NOTE — Patient Instructions (Addendum)
Eat small amounts frequently Drink fluids constantly   If you bleed significantly more - it may meant that you are miscarriage  Keep [place] informed   Continue prenatal vitamins (with food)   Get rest when you can    If vomiting and you feel dehydrated-go to the ER

## 2023-11-13 NOTE — Progress Notes (Signed)
Subjective:    Patient ID: Nicole Mcdaniel, female    DOB: 1996-01-28, 28 y.o.   MRN: [id number]  HPI  Wt Readings from Last 3 Encounters:  11/13/23 152 lb 2 oz (69 kg)  11/04/23 156 lb (70.8 kg)  10/30/23 156 lb 2 oz (70.8 kg)   25.31 kg/m  Vitals:   11/13/23 0926  BP: 124/76  Pulse: 76  Temp: 98.5 F (36.9 C)  SpO2: 97%    Pt presents with spotting during pregnancy   LMP 09/30/23  Per that [facility] is 07/06/24 and she is 6 2/7 weeks today  Was seen in ED on 1/12 for spotting and cramping and nausea   Admission on 11/04/2023, Discharged on 11/04/2023  Component Date Value Ref Range Status   hCG, Beta Chain, Quant, S 11/04/2023 12,597 (H)  <5 mIU/mL Final   Comment:          GEST. AGE      CONC.  (mIU/mL)   <=1 WEEK        5 - 50     2 WEEKS       50 - 500     3 WEEKS       100 - 10,000     4 WEEKS     1,000 - 30,000     5 WEEKS     3,500 - 115,000   6-8 WEEKS     12,000 - 270,000    12 WEEKS     15,000 - 220,000        FEMALE AND NON-PREGNANT FEMALE:     LESS THAN 5 mIU/mL Performed at [facility], [address] [place], [place] [address]    Preg Test, Ur 11/04/2023 POSITIVE (A)  NEGATIVE Final   Comment:        THE SENSITIVITY OF THIS METHODOLOGY IS >24 mIU/mL    Color, Urine 11/04/2023 COLORLESS (A)  YELLOW Final   APPearance 11/04/2023 CLEAR (A)  CLEAR Final   Specific Gravity, Urine 11/04/2023 1.002 (L)  1.005 - 1.030 Final   pH 11/04/2023 7.0  5.0 - 8.0 Final   Glucose, UA 11/04/2023 NEGATIVE  NEGATIVE mg/dL Final   Hgb urine dipstick 11/04/2023 MODERATE (A)  NEGATIVE Final   Bilirubin Urine 11/04/2023 NEGATIVE  NEGATIVE Final   Ketones, ur 11/04/2023 20 (A)  NEGATIVE mg/dL Final   Protein, ur 46/96/2952 NEGATIVE  NEGATIVE mg/dL Final   Nitrite 84/13/2440 NEGATIVE  NEGATIVE Final   Leukocytes,Ua 11/04/2023 NEGATIVE  NEGATIVE Final   RBC / HPF 11/04/2023 0  0 - 5 RBC/hpf Final   WBC, UA 11/04/2023 0-5  0 - 5 WBC/hpf Final   Bacteria, UA  11/04/2023 RARE (A)  NONE SEEN Final   Squamous Epithelial / HPF 11/04/2023 0-5  0 - 5 /HPF Final   Performed at [facility], [address] [place], [place] [address]   WBC 11/04/2023 11.7 (H)  4.0 - 10.5 K/uL Final   RBC 11/04/2023 5.07  3.87 - 5.11 MIL/uL Final   Hemoglobin 11/04/2023 14.8  12.0 - 15.0 g/dL Final   HCT 53/66/4403 43.9  36.0 - 46.0 % Final   MCV 11/04/2023 86.6  80.0 - 100.0 fL Final   MCH 11/04/2023 29.2  26.0 - 34.0 pg Final   MCHC 11/04/2023 33.7  30.0 - 36.0 g/dL Final   RDW 47/42/5956 12.6  11.5 - 15.5 % Final   Platelets 11/04/2023 293  150 - 400 K/uL Final   nRBC  11/04/2023 0.0  0.0 - 0.2 % Final   Performed at [facility], [address] [place], [place] [address]   Sodium 11/04/2023 136  135 - 145 mmol/L Final   Potassium 11/04/2023 3.5  3.5 - 5.1 mmol/L Final   Chloride 11/04/2023 101  98 - 111 mmol/L Final   CO2 11/04/2023 20 (L)  22 - 32 mmol/L Final   Glucose, Bld 11/04/2023 89  70 - 99 mg/dL Final   Glucose reference range applies only to samples taken after fasting for at least 8 hours.   BUN 11/04/2023 8  6 - 20 mg/dL Final   Creatinine, Ser 11/04/2023 0.47  0.44 - 1.00 mg/dL Final   Calcium 69/62/9528 9.7  8.9 - 10.3 mg/dL Final   GFR, Estimated 11/04/2023 >60  >60 mL/min Final   Comment: (NOTE) Calculated using the CKD-EPI Creatinine Equation (2021)    Anion gap 11/04/2023 15  5 - 15 Final   Performed at [facility], [address] [place], [place] [address]   ABO/RH(D) 11/04/2023 O POS   Final   Antibody Screen 11/04/2023 NEG   Final   Sample Expiration 11/04/2023    Final                   Value:11/07/2023,2359 Performed at [facility], [address] [place], [place] [address]     [place] that day  IMPRESSION: Single intrauterine gestation with yolk sac but no fetal pole, measuring 5 weeks 3 days by mean sac diameter, as above. Consider follow-up pelvic ultrasound in 10-14 days to confirm  viability, as clinically warranted.  She called the gyn - they will not see her until 1/27   Now the bleeding stopped for the most part  Biggest clots were smaller than pea Just spotting   Cramping is not a lot- comes and goes /not bad   Nausea comes and goes   Did loose some weight /eating less due to nausea   Is exhausted  Sleep is interrupted   No longer smoking      Patient Active Problem List   Diagnosis Date Noted   Early stage of pregnancy 11/13/2023   Spotting in early pregnancy 11/13/2023   Positive pregnancy test 10/30/2023   Viral URI 10/30/2023   Back pain 07/04/2023   Rib pain 07/04/2023   Stress reaction 06/27/2023   Mild hyperlipidemia 05/22/2023   Light headed 05/16/2023   Tension headache 05/16/2023   Family history of malignant melanoma of skin 02/21/2022   Encounter for routine gynecological examination 05/12/2016   Routine general medical examination at a health care facility 04/30/2016   Allergic rhinitis 01/21/2008   History reviewed. No pertinent past medical history. Past Surgical History:  Procedure Laterality Date   WISDOM TOOTH EXTRACTION     Social History   Tobacco Use   Smoking status: Former    Current packs/day: 0.00    Types: Cigarettes    Quit date: 10/23/2023    Years since quitting: 0.0   Smokeless tobacco: Never  Vaping Use   Vaping status: Never Used  Substance Use Topics   Alcohol use: Yes    Alcohol/week: 0.0 standard drinks of alcohol    Comment: occ   Drug use: No   History reviewed. No pertinent family history. No Known Allergies Current Outpatient Medications on File Prior to Visit  Medication Sig Dispense Refill   Prenatal Vit-Fe Fumarate-FA (PRENATAL VITAMIN PO) Take 1 tablet by mouth daily.  No current facility-administered medications on file prior to visit.    Review of Systems  Constitutional:  Negative for activity change, appetite change, fatigue, fever and unexpected weight change.  HENT:   Negative for congestion, ear pain, rhinorrhea, sinus pressure and sore throat.   Eyes:  Negative for pain, redness and visual disturbance.  Respiratory:  Negative for cough, shortness of breath and wheezing.   Cardiovascular:  Negative for chest pain and palpitations.  Gastrointestinal:  Positive for nausea. Negative for abdominal pain, blood in stool, constipation, diarrhea and vomiting.  Endocrine: Negative for polydipsia and polyuria.  Genitourinary:  Positive for frequency. Negative for dysuria and urgency.       No longer has vaginal bleeding   No dysuria   More frequent urination since becoming pregnant   Musculoskeletal:  Negative for arthralgias, back pain and myalgias.  Skin:  Negative for pallor and rash.  Allergic/Immunologic: Negative for environmental allergies.  Neurological:  Negative for dizziness, syncope and headaches.  Hematological:  Negative for adenopathy. Does not bruise/bleed easily.  Psychiatric/Behavioral:  Negative for decreased concentration and dysphoric mood. The patient is not nervous/anxious.        Objective:   Physical Exam Constitutional:      General: She is not in acute distress.    Appearance: Normal appearance. She is well-developed and normal weight. She is not ill-appearing or diaphoretic.  HENT:     Head: Normocephalic and atraumatic.  Eyes:     Conjunctiva/sclera: Conjunctivae normal.     Pupils: Pupils are equal, round, and reactive to light.  Neck:     Thyroid: No thyromegaly.     Vascular: No carotid bruit or JVD.  Cardiovascular:     Rate and Rhythm: Normal rate and regular rhythm.     Heart sounds: Normal heart sounds.     No gallop.  Pulmonary:     Effort: Pulmonary effort is normal. No respiratory distress.     Breath sounds: Normal breath sounds. No wheezing or rales.  Abdominal:     General: Abdomen is flat. Bowel sounds are normal. There is no distension or abdominal bruit.     Palpations: Abdomen is soft. There is no  hepatomegaly, splenomegaly, mass or pulsatile mass.     Tenderness: There is no abdominal tenderness.     Comments: Unable to palpate fundus above pelvic bone   Musculoskeletal:     Cervical back: Normal range of motion and neck supple.     Right lower leg: No edema.     Left lower leg: No edema.  Lymphadenopathy:     Cervical: No cervical adenopathy.  Skin:    General: Skin is warm and dry.     Coloration: Skin is not pale.     Findings: No rash.  Neurological:     Mental Status: She is alert.     Coordination: Coordination normal.     Deep Tendon Reflexes: Reflexes are normal and symmetric. Reflexes normal.  Psychiatric:        Mood and Affect: Mood normal.           Assessment & Plan:   Problem List Items Addressed This Visit       Other   Spotting in early pregnancy - Primary   Pt with estimated 6 and 2/7 week pregnancy with spotting (clots no larger than a pea)  Went to ED on 1/12 with reassuring work up  Reviewed hospital records, lab results and studies in detail   Hcg corresponds  to dates, urinalysis with blood (likely from spotting , otherwise normal)  Wbc slightly elevated  [place] with single intrauterine gestation with yolk sac and no fetal pole (too early) measuring 5 weeks 3 d at the time  Now spotting stopped  Occational light cramping  Nausea more often but no vomiting and no signs and symptoms of dehydration  Has lost some weight from that  Fatigue  Did successfully quit smoking-commended Taking pnv daily   Did get the what to expect book  Feeling ok today  Antic guidance given- if bleeding re starts and becomes brisk- may be sign of spont ab or threatened   Encouraged to eat regularly -small snacks through the day  Sip fluids consistently through day  Get rest when able  Follow up with obgyn Monday as planned for first visit   Call back and Er precautions noted in detail today   Watching for increase bleeding/ pelvic pain, uti symptoms or other  changes   33  Minutes were spent today both face to face and in the chart obtaining history, reviewing records and test results, performing exam , educating and discussing antic guidance             Early stage of pregnancy

## 2023-11-13 NOTE — Assessment & Plan Note (Addendum)
Pt with estimated 6 and 2/7 week pregnancy with spotting (clots no larger than a pea)  Went to ED on 1/12 with reassuring work up  Reviewed hospital records, lab results and studies in detail   Hcg corresponds to dates, urinalysis with blood (likely from spotting , otherwise normal)  Wbc slightly elevated  [place] with single intrauterine gestation with yolk sac and no fetal pole (too early) measuring 5 weeks 3 d at the time  Now spotting stopped  Occational light cramping  Nausea more often but no vomiting and no signs and symptoms of dehydration  Has lost some weight from that  Fatigue  Did successfully quit smoking-commended Taking pnv daily   Did get the what to expect book  Feeling ok today  Antic guidance given- if bleeding re starts and becomes brisk- may be sign of spont ab or threatened   Encouraged to eat regularly -small snacks through the day  Sip fluids consistently through day  Get rest when able  Follow up with obgyn Monday as planned for first visit   Call back and Er precautions noted in detail today   Watching for increase bleeding/ pelvic pain, uti symptoms or other changes   33  Minutes were spent today both face to face and in the chart obtaining history, reviewing records and test results, performing exam , educating and discussing antic guidance

## 2023-11-19 DIAGNOSIS — N911 Secondary amenorrhea: Secondary | ICD-10-CM | POA: Diagnosis not present

## 2023-11-27 DIAGNOSIS — Z3481 Encounter for supervision of other normal pregnancy, first trimester: Secondary | ICD-10-CM | POA: Diagnosis not present

## 2023-11-27 DIAGNOSIS — Z3685 Encounter for antenatal screening for Streptococcus B: Secondary | ICD-10-CM | POA: Diagnosis not present

## 2023-11-27 LAB — OB RESULTS CONSOLE RPR: RPR: NONREACTIVE

## 2023-11-27 LAB — OB RESULTS CONSOLE ANTIBODY SCREEN: Antibody Screen: NEGATIVE

## 2023-11-27 LAB — OB RESULTS CONSOLE HEPATITIS B SURFACE ANTIGEN: Hepatitis B Surface Ag: NEGATIVE

## 2023-11-27 LAB — OB RESULTS CONSOLE HIV ANTIBODY (ROUTINE TESTING): HIV: NONREACTIVE

## 2023-11-27 LAB — OB RESULTS CONSOLE RUBELLA ANTIBODY, IGM: Rubella: NON-IMMUNE/NOT IMMUNE

## 2023-11-27 LAB — HEPATITIS C ANTIBODY: HCV Ab: NEGATIVE

## 2023-12-03 ENCOUNTER — Encounter: Payer: Self-pay | Admitting: Emergency Medicine

## 2023-12-03 ENCOUNTER — Other Ambulatory Visit: Payer: Self-pay

## 2023-12-03 ENCOUNTER — Emergency Department
Admission: EM | Admit: 2023-12-03 | Discharge: 2023-12-03 | Disposition: A | Discharge status: Home / Self Care | Payer: 59 | Attending: Emergency Medicine | Admitting: Emergency Medicine

## 2023-12-03 DIAGNOSIS — Z3A09 9 weeks gestation of pregnancy: Secondary | ICD-10-CM | POA: Diagnosis not present

## 2023-12-03 DIAGNOSIS — O219 Vomiting of pregnancy, unspecified: Secondary | ICD-10-CM | POA: Diagnosis not present

## 2023-12-03 DIAGNOSIS — O99611 Diseases of the digestive system complicating pregnancy, first trimester: Secondary | ICD-10-CM | POA: Insufficient documentation

## 2023-12-03 DIAGNOSIS — Z20822 Contact with and (suspected) exposure to covid-19: Secondary | ICD-10-CM | POA: Insufficient documentation

## 2023-12-03 DIAGNOSIS — K529 Noninfective gastroenteritis and colitis, unspecified: Secondary | ICD-10-CM | POA: Insufficient documentation

## 2023-12-03 DIAGNOSIS — Z3A01 Less than 8 weeks gestation of pregnancy: Secondary | ICD-10-CM | POA: Diagnosis not present

## 2023-12-03 LAB — COMPREHENSIVE METABOLIC PANEL
ALT: 18 U/L (ref 0–44)
AST: 19 U/L (ref 15–41)
Albumin: 4.1 g/dL (ref 3.5–5.0)
Alkaline Phosphatase: 46 U/L (ref 38–126)
Anion gap: 14 (ref 5–15)
BUN: 15 mg/dL (ref 6–20)
CO2: 17 mmol/L — ABNORMAL LOW (ref 22–32)
Calcium: 8.9 mg/dL (ref 8.9–10.3)
Chloride: 104 mmol/L (ref 98–111)
Creatinine, Ser: 0.44 mg/dL (ref 0.44–1.00)
GFR, Estimated: >60 mL/min (ref >60)
Glucose, Bld: 146 mg/dL — ABNORMAL HIGH (ref 70–99)
Potassium: 3.7 mmol/L (ref 3.5–5.1)
Sodium: 135 mmol/L (ref 135–145)
Total Bilirubin: 0.8 mg/dL (ref 0.0–1.2)
Total Protein: 7.4 g/dL (ref 6.5–8.1)

## 2023-12-03 LAB — URINALYSIS, ROUTINE W REFLEX MICROSCOPIC
Bilirubin Urine: NEGATIVE
Glucose, UA: NEGATIVE mg/dL
Hgb urine dipstick: NEGATIVE
Ketones, ur: NEGATIVE mg/dL
Leukocytes,Ua: NEGATIVE
Nitrite: NEGATIVE
Protein, ur: 30 mg/dL — AB
Specific Gravity, Urine: 1.032 — ABNORMAL HIGH (ref 1.005–1.030)
pH: 5 (ref 5.0–8.0)

## 2023-12-03 LAB — CBC
HCT: 44 % (ref 36.0–46.0)
Hemoglobin: 15 g/dL (ref 12.0–15.0)
MCH: 29.5 pg (ref 26.0–34.0)
MCHC: 34.1 g/dL (ref 30.0–36.0)
MCV: 86.6 fL (ref 80.0–100.0)
Platelets: 239 10*3/uL (ref 150–400)
RBC: 5.08 MIL/uL (ref 3.87–5.11)
RDW: 12.9 % (ref 11.5–15.5)
WBC: 10.3 10*3/uL (ref 4.0–10.5)
nRBC: 0 % (ref 0.0–0.2)

## 2023-12-03 LAB — HCG, QUANTITATIVE, PREGNANCY: hCG, Beta Chain, Quant, S: 130243 m[IU]/mL — ABNORMAL HIGH (ref <5)

## 2023-12-03 LAB — RESP PANEL BY RT-PCR (RSV, FLU A&B, COVID)  RVPGX2
Influenza A by PCR: NEGATIVE
Influenza B by PCR: NEGATIVE
Resp Syncytial Virus by PCR: NEGATIVE
SARS Coronavirus 2 by RT PCR: NEGATIVE

## 2023-12-03 LAB — LIPASE, BLOOD: Lipase: 21 U/L (ref 11–51)

## 2023-12-03 MED ORDER — ONDANSETRON HCL 4 MG/2ML IJ SOLN
4.0000 mg | Freq: Once | INTRAMUSCULAR | Status: AC
  Administered 2023-12-03: 4 mg via INTRAVENOUS
  Filled 2023-12-03: qty 2

## 2023-12-03 MED ORDER — SODIUM CHLORIDE 0.9 % IV BOLUS
1000.0000 mL | Freq: Once | INTRAVENOUS | Status: AC
  Administered 2023-12-03: 1000 mL via INTRAVENOUS

## 2023-12-03 MED ORDER — ONDANSETRON HCL 4 MG PO TABS: 4.0000 mg | ORAL_TABLET | Freq: Three times a day (TID) | ORAL | 0 refills | Status: DC | PRN

## 2023-12-03 NOTE — Discharge Instructions (Addendum)
 Please stay well hydrated.   Clear liquids today and progress to bland foods tomorrow if tolerated.  Return to the ER for persistent nausea or vomiting.

## 2023-12-03 NOTE — ED Triage Notes (Signed)
 Pt in via POV, reports ongoing N/V/D since last night, states she is unable to keep fluids down.    Patient is [redacted] weeks pregnant, denies any previous complications.   NAD noted at this time.

## 2023-12-03 NOTE — ED Provider Notes (Signed)
[facility] Provider Note    Event Date/Time   First MD Initiated Contact with Patient 12/03/23 1103     (approximate)   History   Emesis and Diarrhea   HPI  Nicole Mcdaniel is a 28 y.o. female  [redacted] weeks pregnant and as listed in EMR presents to the emergency department for evaluation of nausea, vomiting, and diarrhea since 9:30pm last night. She is unable to keep fluids down today. She has had some nausea in the mornings, but this feels different. She denies fever.     Physical Exam   Triage Vital Signs: ED Triage Vitals [12/03/23 1047]  Encounter Vitals Group     BP 128/81     Systolic BP Percentile      Diastolic BP Percentile      Pulse Rate (!) 126     Resp 18     Temp 99.2 F (37.3 C)     Temp src      SpO2 98 %     Weight 155 lb (70.3 kg)     Height 5\' 5"  (1.651 m)     Head Circumference      Peak Flow      Pain Score 7     Pain Loc      Pain Education      Exclude from Growth Chart     Most recent vital signs: Vitals:   12/03/23 1047 12/03/23 1300  BP: 128/81 124/72  Pulse: (!) 126 (!) 104  Resp: 18 17  Temp: 99.2 F (37.3 C) 98.8 F (37.1 C)  SpO2: 98% 96%    General: Awake, no distress.  CV:  Good peripheral perfusion.  Resp:  Normal effort.  Abd:  No distention.  Other:     ED Results / Procedures / Treatments   Labs (all labs ordered are listed, but only abnormal results are displayed) Labs Reviewed  COMPREHENSIVE METABOLIC PANEL - Abnormal; Notable for the following components:      Result Value   CO2 17 (*)    Glucose, Bld 146 (*)    All other components within normal limits  URINALYSIS, ROUTINE W REFLEX MICROSCOPIC - Abnormal; Notable for the following components:   Color, Urine YELLOW (*)    APPearance HAZY (*)    Specific Gravity, Urine 1.032 (*)    Protein, ur 30 (*)    Bacteria, UA FEW (*)    All other components within normal limits  HCG, QUANTITATIVE, PREGNANCY - Abnormal; Notable for the  following components:   hCG, Beta Chain, Quant, S 130,243 (*)    All other components within normal limits  RESP PANEL BY RT-PCR (RSV, FLU A&B, COVID)  RVPGX2  LIPASE, BLOOD  CBC     EKG  Not indicated   RADIOLOGY  Image and radiology report reviewed and interpreted by me. Radiology report consistent with the same.  Not indicated  PROCEDURES:  Critical Care performed: No  Procedures   MEDICATIONS ORDERED IN ED:  Medications  sodium chloride 0.9 % bolus 1,000 mL (0 mLs Intravenous Stopped 12/03/23 1248)  ondansetron (ZOFRAN) injection 4 mg (4 mg Intravenous Given 12/03/23 1147)     IMPRESSION / MDM / ASSESSMENT AND PLAN / ED COURSE   I have reviewed the triage note.  Differential diagnosis includes, but is not limited to, gastroenteritis, hyperemesis gravidarum, influenza  Patient's presentation is most consistent with acute illness / injury with system symptoms.  28 year old female presenting to the emergency department  for treatment and evaluation of nausea, vomiting, and diarrhea.  See HPI.  She is noted to be tachycardic with low-grade temp of 99.2.  Other vitals are reassuring. ----------------------------------------- 12:53 PM on 12/03/2023 ----------------------------------------- Labs are reassuring.  Respiratory panel is negative.  Symptoms likely secondary to gastroenteritis that is prevalent in the community at this time.  Patient now feeling much better and ready for discharge home. She has been able to tolerate ice chips and ginger ale while here without increase in nausea or episode of vomiting.      FINAL CLINICAL IMPRESSION(S) / ED DIAGNOSES   Final diagnoses:  Gastroenteritis     Rx / DC Orders   ED Discharge Orders          Ordered    ondansetron (ZOFRAN) 4 MG tablet  Every 8 hours PRN        12/03/23 1259             Note:  This document was prepared using Dragon voice recognition software and may include unintentional  dictation errors.   Chinita Pester, FNP 12/04/23 1449    Jene Every, MD 12/07/23 [phone]

## 2023-12-03 NOTE — ED Notes (Signed)
 Patient made aware of need for urine specimen; states she is unable to go at this time.  Labeled urine cup provided; advised to bring to First Nurse.

## 2023-12-19 DIAGNOSIS — Z124 Encounter for screening for malignant neoplasm of cervix: Secondary | ICD-10-CM | POA: Diagnosis not present

## 2023-12-19 LAB — OB RESULTS CONSOLE GC/CHLAMYDIA
Chlamydia: NEGATIVE
Neisseria Gonorrhea: NEGATIVE

## 2023-12-25 DIAGNOSIS — Z3481 Encounter for supervision of other normal pregnancy, first trimester: Secondary | ICD-10-CM | POA: Diagnosis not present

## 2024-01-14 DIAGNOSIS — Z361 Encounter for antenatal screening for raised alphafetoprotein level: Secondary | ICD-10-CM | POA: Diagnosis not present

## 2024-02-12 DIAGNOSIS — Z363 Encounter for antenatal screening for malformations: Secondary | ICD-10-CM | POA: Diagnosis not present

## 2024-02-12 DIAGNOSIS — Z3A19 19 weeks gestation of pregnancy: Secondary | ICD-10-CM | POA: Diagnosis not present

## 2024-02-23 ENCOUNTER — Observation Stay
Admission: EM | Admit: 2024-02-23 | Discharge: 2024-02-23 | Disposition: A | Discharge status: Home / Self Care | Attending: Obstetrics and Gynecology | Admitting: Obstetrics and Gynecology

## 2024-02-23 ENCOUNTER — Other Ambulatory Visit: Payer: Self-pay

## 2024-02-23 DIAGNOSIS — Z3A20 20 weeks gestation of pregnancy: Secondary | ICD-10-CM | POA: Insufficient documentation

## 2024-02-23 DIAGNOSIS — O26892 Other specified pregnancy related conditions, second trimester: Secondary | ICD-10-CM | POA: Diagnosis not present

## 2024-02-23 DIAGNOSIS — Z87891 Personal history of nicotine dependence: Secondary | ICD-10-CM | POA: Insufficient documentation

## 2024-02-23 DIAGNOSIS — R1031 Right lower quadrant pain: Secondary | ICD-10-CM | POA: Diagnosis not present

## 2024-02-23 DIAGNOSIS — O99891 Other specified diseases and conditions complicating pregnancy: Secondary | ICD-10-CM | POA: Diagnosis not present

## 2024-02-23 DIAGNOSIS — O26899 Other specified pregnancy related conditions, unspecified trimester: Principal | ICD-10-CM | POA: Diagnosis present

## 2024-02-23 DIAGNOSIS — R102 Pelvic and perineal pain: Secondary | ICD-10-CM | POA: Diagnosis not present

## 2024-02-23 LAB — URINALYSIS, COMPLETE (UACMP) WITH MICROSCOPIC
Bilirubin Urine: NEGATIVE
Glucose, UA: NEGATIVE mg/dL
Hgb urine dipstick: NEGATIVE
Ketones, ur: NEGATIVE mg/dL
Leukocytes,Ua: NEGATIVE
Nitrite: NEGATIVE
Protein, ur: NEGATIVE mg/dL
Specific Gravity, Urine: 1.003 — ABNORMAL LOW (ref 1.005–1.030)
pH: 7 (ref 5.0–8.0)

## 2024-02-23 LAB — WET PREP, GENITAL
Clue Cells Wet Prep HPF POC: NONE SEEN
Sperm: NONE SEEN
Trich, Wet Prep: NONE SEEN
WBC, Wet Prep HPF POC: <10 (ref <10)
Yeast Wet Prep HPF POC: NONE SEEN

## 2024-02-23 MED ORDER — ACETAMINOPHEN 325 MG PO TABS: 650.0000 mg | ORAL_TABLET | ORAL | Status: DC | PRN

## 2024-02-23 MED ORDER — CALCIUM CARBONATE ANTACID 500 MG PO CHEW: 2.0000 | CHEWABLE_TABLET | ORAL | Status: DC | PRN

## 2024-02-23 MED ORDER — PRENATAL MULTIVITAMIN CH: 1.0000 | ORAL_TABLET | Freq: Every day | ORAL | Status: DC

## 2024-02-23 MED ORDER — ZOLPIDEM TARTRATE 5 MG PO TABS: 5.0000 mg | ORAL_TABLET | Freq: Every evening | ORAL | Status: DC | PRN

## 2024-02-23 MED ORDER — DOCUSATE SODIUM 100 MG PO CAPS: 100.0000 mg | ORAL_CAPSULE | Freq: Every day | ORAL | Status: DC

## 2024-02-23 NOTE — Discharge Summary (Signed)
 Nicole Mcdaniel is a 28 y.o. female. She is at [redacted]w[redacted]d gestation. Patient's last menstrual period was 09/30/2023. Estimated Date of Delivery: 07/06/24  Prenatal care site: [facility] (unassigned)  Current pregnancy complicated by:  - none, per patient's report  Chief complaint: RLQ pain that started around 0430  She reports she woke up this morning around 0430 and experienced RLQ pain that radiates from her hip to her pubic bone along the side of the uterus. The pain has improved but is still present. Certain positions make it better or worse. She denies contractions or bleeding. Good fetal movement.  S: Resting comfortably. no CTX, no VB.no LOF,  Active fetal movement.  Denies: HA, visual changes, SOB, or RUQ/epigastric pain  Maternal Medical History:  History reviewed. No pertinent past medical history.  Past Surgical History:  Procedure Laterality Date   WISDOM TOOTH EXTRACTION      No Known Allergies  Prior to Admission medications   Medication Sig Start Date End Date Taking? Authorizing Provider  Prenatal Vit-Fe Fumarate-FA (PRENATAL VITAMIN PO) Take 1 tablet by mouth daily.   Yes [provider]  ondansetron  (ZOFRAN ) 4 MG tablet Take 1 tablet (4 mg total) by mouth every 8 (eight) hours as needed for nausea or vomiting. 12/03/23   Triplett, Davene Ernst B, FNP    Social History: She  reports that she quit smoking about 4 months ago. Her smoking use included cigarettes. She has never used smokeless tobacco. She reports current alcohol use. She reports that she does not use drugs.  Family History: family history is not on file.  no history of gyn cancers  Review of Systems: A full review of systems was performed and negative except as noted in the HPI.     O:  LMP 09/30/2023  Results for orders placed or performed during the hospital encounter of 02/23/24 (from the past 48 hours)  Wet prep, genital   Collection Time: 02/23/24  6:36 AM  Result Value Ref Range    Yeast Wet Prep HPF POC NONE SEEN NONE SEEN   Trich, Wet Prep NONE SEEN NONE SEEN   Clue Cells Wet Prep HPF POC NONE SEEN NONE SEEN   WBC, Wet Prep HPF POC <10 <10   Sperm NONE SEEN   Urinalysis, Complete w Microscopic -Urine, Clean Catch   Collection Time: 02/23/24  6:36 AM  Result Value Ref Range   Color, Urine STRAW (A) YELLOW   APPearance CLEAR (A) CLEAR   Specific Gravity, Urine 1.003 (L) 1.005 - 1.030   pH 7.0 5.0 - 8.0   Glucose, UA NEGATIVE NEGATIVE mg/dL   Hgb urine dipstick NEGATIVE NEGATIVE   Bilirubin Urine NEGATIVE NEGATIVE   Ketones, ur NEGATIVE NEGATIVE mg/dL   Protein, ur NEGATIVE NEGATIVE mg/dL   Nitrite NEGATIVE NEGATIVE   Leukocytes,Ua NEGATIVE NEGATIVE   RBC / HPF 0-5 0 - 5 RBC/hpf   WBC, UA 0-5 0 - 5 WBC/hpf   Bacteria, UA RARE (A) NONE SEEN   Squamous Epithelial / HPF 0-5 0 - 5 /HPF     Constitutional: NAD, AAOx3  HE/ENT: extraocular movements grossly intact, moist mucous membranes CV: RRR PULM: nl respiratory effort, CTABL     Abd: gravid, non-tender, non-distended, soft      Ext: Non-tender, Nonedematous   Psych: mood appropriate, speech normal Pelvic: SSE performed, cervix closed/thick, normal physiologic discharge  Pelvic exam: normal external genitalia, vulva, vagina, cervix, uterus and adnexa.  Fetal  monitoring: 155bpm by handheld Doppler  A/P:  28 y.o. [redacted]w[redacted]d here for antenatal surveillance for round ligament pain  Principle Diagnosis:  Round ligament pain  Round ligament pain: the pain is present in the area of the round ligament. Negative UA and wet prep. Advised stretches, rest, and Tylenol for hte pain. Reviewed normal vs abnormal symptoms and when to return. Labor: not present. Cervix closed/thick, normal discharge Fetal Wellbeing: Reassuring FHT via handheld doppler D/c home stable, precautions reviewed, follow-up as scheduled.    Auston Left, CNM 02/23/2024 7:22 AM

## 2024-02-23 NOTE — OB Triage Note (Signed)
 Patient is a  28 yo, G1 P0, at 20 weeks 6 days. Patient presents with complaints of abdominal pain.  Patient denies any vaginal bleeding or LOF. Patient reports +FM. Monitors applied and assessing. VSS. Initial fetal heart tone 155 bpm. Wilson, CNM notified of patients arrival to unit. Plan to complete labs

## 2024-02-23 NOTE — OB Triage Note (Signed)
 Patient given discharge instructions as per AVS. Verbalized understanding. All questions answered. Patient discharged in stable condition, ambulatory, accompanied by significant other.

## 2024-02-23 NOTE — Discharge Instructions (Signed)

## 2024-03-12 ENCOUNTER — Encounter: Payer: Self-pay | Admitting: Family Medicine

## 2024-03-12 ENCOUNTER — Ambulatory Visit: Admitting: Family Medicine

## 2024-03-12 VITALS — BP 132/78 | HR 87 | Temp 98.5°F | Ht 65.0 in | Wt 173.4 lb

## 2024-03-12 DIAGNOSIS — J069 Acute upper respiratory infection, unspecified: Secondary | ICD-10-CM | POA: Diagnosis not present

## 2024-03-12 DIAGNOSIS — R0981 Nasal congestion: Secondary | ICD-10-CM

## 2024-03-12 LAB — POC COVID19 BINAXNOW: SARS Coronavirus 2 Ag: NEGATIVE

## 2024-03-12 NOTE — Assessment & Plan Note (Signed)
 In pt with 23 wk pregnancy Primarily nasal symptoms  Reassuring exam  Neg covid test Discussed what is safe for symptom care in pregnancy Disc symptomatic care - see instructions on AVS  Zyrtec  Nasal saline Acetaminophen  if needed  Update if not starting to improve in a week or if worsening  Call back and Er precautions noted in detail today

## 2024-03-12 NOTE — Patient Instructions (Addendum)
 Negative covid test  I think this is a viral cold   Drink lots of fluids   Continue zyrtec daily  Use nasal saline spray for congestion  Breathe steam  Tylenol  for pain as needed   Keep us  posted   If any severe symptoms/trouble breathing -go to the ER  Update if not starting to improve in a week or if worsening

## 2024-03-12 NOTE — Progress Notes (Signed)
 Subjective:    Patient ID: Nicole Mcdaniel, female    DOB: December 01, 1995, 28 y.o.   MRN: [id number]  HPI  Wt Readings from Last 3 Encounters:  03/12/24 173 lb 6 oz (78.6 kg)  12/03/23 155 lb (70.3 kg)  11/13/23 152 lb 2 oz (69 kg)   28.85 kg/m  Vitals:   03/12/24 1051  BP: 132/78  Pulse: 87  Temp: 98.5 F (36.9 C)  SpO2: 98%   Pt presents with uri symptoms  She is currently pregnant   [redacted] weeks now Due in sept   Symptoms started Sunday night - throat felt funny  Monday scratchy  Congestion started yesterday  ST is improved today   Nose runs Feels stuffy  Cannot get much out- is clear   No fever Does not feel bad   No headache A little ear pressure on and off   A little cough from pnd -not bad     Over the counter  Zyrtec   (allergies are worse this year)    Results for orders placed or performed in visit on 03/12/24  POC COVID-19   Collection Time: 03/12/24 11:09 AM  Result Value Ref Range   SARS Coronavirus 2 Ag Negative Negative       Patient Active Problem List   Diagnosis Date Noted   Pain of round ligament affecting pregnancy, antepartum 02/23/2024   Early stage of pregnancy 11/13/2023   Spotting in early pregnancy 11/13/2023   Positive pregnancy test 10/30/2023   Viral URI 10/30/2023   Back pain 07/04/2023   Rib pain 07/04/2023   Stress reaction 06/27/2023   Mild hyperlipidemia 05/22/2023   Light headed 05/16/2023   Tension headache 05/16/2023   Family history of malignant melanoma of skin 02/21/2022   Encounter for routine gynecological examination 05/12/2016   Routine general medical examination at a health care facility 04/30/2016   Allergic rhinitis 01/21/2008   History reviewed. No pertinent past medical history. Past Surgical History:  Procedure Laterality Date   WISDOM TOOTH EXTRACTION     Social History   Tobacco Use   Smoking status: Former    Current packs/day: 0.00    Types: Cigarettes    Quit date: 10/23/2023     Years since quitting: 0.3   Smokeless tobacco: Never  Vaping Use   Vaping status: Never Used  Substance Use Topics   Alcohol use: Yes   Drug use: No   History reviewed. No pertinent family history. No Known Allergies Current Outpatient Medications on File Prior to Visit  Medication Sig Dispense Refill   cetirizine (ZYRTEC) 10 MG chewable tablet Chew 10 mg by mouth daily as needed for allergies.     Prenatal Vit-Fe Fumarate-FA (PRENATAL VITAMIN PO) Take 1 tablet by mouth daily.     No current facility-administered medications on file prior to visit.    Review of Systems  Constitutional:  Positive for appetite change and fatigue. Negative for chills and fever.  HENT:  Positive for congestion, postnasal drip, rhinorrhea, sinus pressure, sneezing and sore throat. Negative for ear pain.   Eyes:  Negative for pain and discharge.  Respiratory:  Positive for cough. Negative for shortness of breath, wheezing and stridor.   Cardiovascular:  Negative for chest pain.  Gastrointestinal:  Negative for diarrhea, nausea and vomiting.  Genitourinary:  Negative for frequency, hematuria and urgency.  Musculoskeletal:  Negative for arthralgias and myalgias.  Skin:  Negative for rash.  Neurological:  Negative for dizziness, weakness, light-headedness and headaches.  Psychiatric/Behavioral:  Negative for confusion and dysphoric mood.        Objective:   Physical Exam Constitutional:      General: She is not in acute distress.    Appearance: Normal appearance. She is well-developed and normal weight. She is not ill-appearing, toxic-appearing or diaphoretic.  HENT:     Head: Normocephalic and atraumatic.     Comments: Nares are injected and congested      Right Ear: Tympanic membrane, ear canal and external ear normal.     Left Ear: Tympanic membrane, ear canal and external ear normal.     Nose: Congestion and rhinorrhea present.     Mouth/Throat:     Mouth: Mucous membranes are moist.      Pharynx: Oropharynx is clear. No oropharyngeal exudate or posterior oropharyngeal erythema.     Comments: Clear pnd  Eyes:     General:        Right eye: No discharge.        Left eye: No discharge.     Conjunctiva/sclera: Conjunctivae normal.     Pupils: Pupils are equal, round, and reactive to light.  Cardiovascular:     Rate and Rhythm: Normal rate.     Heart sounds: Normal heart sounds.  Pulmonary:     Effort: Pulmonary effort is normal. No respiratory distress.     Breath sounds: Normal breath sounds. No stridor. No wheezing, rhonchi or rales.  Chest:     Chest wall: No tenderness.  Musculoskeletal:     Cervical back: Normal range of motion and neck supple.  Lymphadenopathy:     Cervical: No cervical adenopathy.  Skin:    General: Skin is warm and dry.     Capillary Refill: Capillary refill takes less than 2 seconds.     Findings: No rash.  Neurological:     Mental Status: She is alert.     Cranial Nerves: No cranial nerve deficit.  Psychiatric:        Mood and Affect: Mood normal.           Assessment & Plan:   Problem List Items Addressed This Visit       Respiratory   Viral URI   In pt with 23 wk pregnancy Primarily nasal symptoms  Reassuring exam  Neg covid test Discussed what is safe for symptom care in pregnancy Disc symptomatic care - see instructions on AVS  Zyrtec  Nasal saline Acetaminophen  if needed  Update if not starting to improve in a week or if worsening  Call back and Er precautions noted in detail today        Other Visit Diagnoses       Nasal congestion    -  Primary   Relevant Orders   POC COVID-19 (Completed)

## 2024-03-13 DIAGNOSIS — R350 Frequency of micturition: Secondary | ICD-10-CM | POA: Diagnosis not present

## 2024-03-13 DIAGNOSIS — Z3A23 23 weeks gestation of pregnancy: Secondary | ICD-10-CM | POA: Diagnosis not present

## 2024-03-13 DIAGNOSIS — Z3402 Encounter for supervision of normal first pregnancy, second trimester: Secondary | ICD-10-CM | POA: Diagnosis not present

## 2024-04-15 DIAGNOSIS — Z3403 Encounter for supervision of normal first pregnancy, third trimester: Secondary | ICD-10-CM | POA: Diagnosis not present

## 2024-04-15 DIAGNOSIS — Z3A28 28 weeks gestation of pregnancy: Secondary | ICD-10-CM | POA: Diagnosis not present

## 2024-04-15 DIAGNOSIS — Z3480 Encounter for supervision of other normal pregnancy, unspecified trimester: Secondary | ICD-10-CM | POA: Diagnosis not present

## 2024-04-15 DIAGNOSIS — Z23 Encounter for immunization: Secondary | ICD-10-CM | POA: Diagnosis not present

## 2024-04-30 DIAGNOSIS — R030 Elevated blood-pressure reading, without diagnosis of hypertension: Secondary | ICD-10-CM | POA: Diagnosis not present

## 2024-05-06 DIAGNOSIS — Z808 Family history of malignant neoplasm of other organs or systems: Secondary | ICD-10-CM | POA: Diagnosis not present

## 2024-05-06 DIAGNOSIS — D2262 Melanocytic nevi of left upper limb, including shoulder: Secondary | ICD-10-CM | POA: Diagnosis not present

## 2024-05-06 DIAGNOSIS — D225 Melanocytic nevi of trunk: Secondary | ICD-10-CM | POA: Diagnosis not present

## 2024-05-06 DIAGNOSIS — D2272 Melanocytic nevi of left lower limb, including hip: Secondary | ICD-10-CM | POA: Diagnosis not present

## 2024-05-06 DIAGNOSIS — D2261 Melanocytic nevi of right upper limb, including shoulder: Secondary | ICD-10-CM | POA: Diagnosis not present

## 2024-05-06 DIAGNOSIS — D2271 Melanocytic nevi of right lower limb, including hip: Secondary | ICD-10-CM | POA: Diagnosis not present

## 2024-05-14 DIAGNOSIS — Z3482 Encounter for supervision of other normal pregnancy, second trimester: Secondary | ICD-10-CM | POA: Diagnosis not present

## 2024-05-14 DIAGNOSIS — Z3483 Encounter for supervision of other normal pregnancy, third trimester: Secondary | ICD-10-CM | POA: Diagnosis not present

## 2024-05-15 DIAGNOSIS — O133 Gestational [pregnancy-induced] hypertension without significant proteinuria, third trimester: Secondary | ICD-10-CM | POA: Diagnosis not present

## 2024-05-15 DIAGNOSIS — Z3A32 32 weeks gestation of pregnancy: Secondary | ICD-10-CM | POA: Diagnosis not present

## 2024-05-15 DIAGNOSIS — Z3483 Encounter for supervision of other normal pregnancy, third trimester: Secondary | ICD-10-CM | POA: Diagnosis not present

## 2024-05-21 DIAGNOSIS — O99891 Other specified diseases and conditions complicating pregnancy: Secondary | ICD-10-CM | POA: Diagnosis not present

## 2024-05-21 DIAGNOSIS — Z3A33 33 weeks gestation of pregnancy: Secondary | ICD-10-CM | POA: Diagnosis not present

## 2024-05-21 DIAGNOSIS — Z3483 Encounter for supervision of other normal pregnancy, third trimester: Secondary | ICD-10-CM | POA: Diagnosis not present

## 2024-05-23 ENCOUNTER — Inpatient Hospital Stay (HOSPITAL_COMMUNITY)
Admission: AD | Admit: 2024-05-23 | Discharge: 2024-05-23 | Disposition: A | Discharge status: Home / Self Care | Attending: Obstetrics and Gynecology | Admitting: Obstetrics and Gynecology

## 2024-05-23 ENCOUNTER — Encounter (HOSPITAL_COMMUNITY): Payer: Self-pay | Admitting: Obstetrics and Gynecology

## 2024-05-23 DIAGNOSIS — Z3689 Encounter for other specified antenatal screening: Secondary | ICD-10-CM

## 2024-05-23 DIAGNOSIS — O133 Gestational [pregnancy-induced] hypertension without significant proteinuria, third trimester: Secondary | ICD-10-CM

## 2024-05-23 DIAGNOSIS — Z3A33 33 weeks gestation of pregnancy: Secondary | ICD-10-CM | POA: Diagnosis not present

## 2024-05-23 HISTORY — DX: Other specified health status: Z78.9

## 2024-05-23 LAB — CBC
HCT: 37.8 % (ref 36.0–46.0)
Hemoglobin: 12.8 g/dL (ref 12.0–15.0)
MCH: 30 pg (ref 26.0–34.0)
MCHC: 33.9 g/dL (ref 30.0–36.0)
MCV: 88.7 fL (ref 80.0–100.0)
Platelets: 160 K/uL (ref 150–400)
RBC: 4.26 MIL/uL (ref 3.87–5.11)
RDW: 13.3 % (ref 11.5–15.5)
WBC: 10.8 K/uL — ABNORMAL HIGH (ref 4.0–10.5)
nRBC: 0 % (ref 0.0–0.2)

## 2024-05-23 LAB — COMPREHENSIVE METABOLIC PANEL WITH GFR
ALT: 21 U/L (ref 0–44)
AST: 19 U/L (ref 15–41)
Albumin: 2.9 g/dL — ABNORMAL LOW (ref 3.5–5.0)
Alkaline Phosphatase: 138 U/L — ABNORMAL HIGH (ref 38–126)
Anion gap: 12 (ref 5–15)
BUN: 6 mg/dL (ref 6–20)
CO2: 20 mmol/L — ABNORMAL LOW (ref 22–32)
Calcium: 9.4 mg/dL (ref 8.9–10.3)
Chloride: 107 mmol/L (ref 98–111)
Creatinine, Ser: 0.39 mg/dL — ABNORMAL LOW (ref 0.44–1.00)
GFR, Estimated: >60 mL/min (ref >60)
Glucose, Bld: 89 mg/dL (ref 70–99)
Potassium: 3.7 mmol/L (ref 3.5–5.1)
Sodium: 139 mmol/L (ref 135–145)
Total Bilirubin: 0.4 mg/dL (ref 0.0–1.2)
Total Protein: 6.1 g/dL — ABNORMAL LOW (ref 6.5–8.1)

## 2024-05-23 LAB — URINALYSIS, ROUTINE W REFLEX MICROSCOPIC
Bilirubin Urine: NEGATIVE
Glucose, UA: NEGATIVE mg/dL
Hgb urine dipstick: NEGATIVE
Ketones, ur: NEGATIVE mg/dL
Leukocytes,Ua: NEGATIVE
Nitrite: NEGATIVE
Protein, ur: NEGATIVE mg/dL
Specific Gravity, Urine: 1.003 — ABNORMAL LOW (ref 1.005–1.030)
pH: 7 (ref 5.0–8.0)

## 2024-05-23 LAB — PROTEIN / CREATININE RATIO, URINE
Creatinine, Urine: 15 mg/dL
Total Protein, Urine: <6 mg/dL

## 2024-05-23 MED ORDER — LABETALOL HCL 100 MG PO TABS
100.0000 mg | ORAL_TABLET | Freq: Once | ORAL | Status: AC
  Administered 2024-05-23: 100 mg via ORAL
  Filled 2024-05-23: qty 1

## 2024-05-23 MED ORDER — LABETALOL HCL 100 MG PO TABS: 100.0000 mg | ORAL_TABLET | Freq: Two times a day (BID) | ORAL | 1 refills | Status: DC

## 2024-05-23 NOTE — MAU Note (Signed)
 MAU Triage Note:  .Nicole Mcdaniel is a 28 y.o. at [redacted]w[redacted]d here in MAU reporting: HBP at home after having her neighbor check it - 142/100. Did notice some dizziness earlier that has since resolved. Denies pain, VB or LOF. Reports +FM.   PIH Assessment: Headache present: No  Visual disturbances: None RUQ pain/Epigastric: None Atypical edema: None Hx of HBP: GHTN BP Medications: None prescribed   Patient complaint: hbp   Denies pain    Onset of complaint: today LMP: Patient's last menstrual period was 09/30/2023.  Vitals:   05/23/24 2115  BP: (!) 147/88  Pulse: 75  Resp: 16  Temp: 98.1 F (36.7 C)  SpO2: 97%    FHT:  Fetal Heart Rate Mode: External Baseline Rate (A): 160 bpm Lab orders placed from triage: p/c ratio

## 2024-05-23 NOTE — MAU Provider Note (Signed)
 History     CSN: [id number]  Arrival date and time: 05/23/24 2053   Event Date/Time   First Provider Initiated Contact with Patient 05/23/24 2142      Chief Complaint  Patient presents with   Hypertension   Nicole Mcdaniel is a 28 y.o. G1P0 at [redacted]w[redacted]d who receives care at [facility].  She reports her next appt is Thursday August 7th.   She presents today for elevated blood pressure. She states around 1830-1900 she felt dizzy and took her blood pressure.  She states on her electric cuff it was 150s and she was able to have family member obtain manual pressure of 140s/100s.  She denies current HA, but reports fleeting HA.  She denies RUQ pain, no visual disturbances, or abnormal swelling. She reports previous diagnosis of GHTN at 3-4 weeks ago. She is not currently on any medications.   Patient endorses fetal movement. She reports some occasional contractions, but denies cramping. She denies vaginal bleeding or discharge of concern.    OB History     Gravida  1   Para      Term      Preterm      AB      Living         SAB      IAB      Ectopic      Multiple      Live Births              Past Medical History:  Diagnosis Date   Medical history non-contributory     Past Surgical History:  Procedure Laterality Date   WISDOM TOOTH EXTRACTION      History reviewed. No pertinent family history.  Social History   Tobacco Use   Smoking status: Former    Current packs/day: 0.00    Types: Cigarettes    Quit date: 10/23/2023    Years since quitting: 0.5   Smokeless tobacco: Never  Vaping Use   Vaping status: Never Used  Substance Use Topics   Alcohol use: Not Currently   Drug use: No    Allergies: No Known Allergies  Medications Prior to Admission  Medication Sig Dispense Refill Last Dose/Taking   Prenatal Vit-Fe Fumarate-FA (PRENATAL VITAMIN PO) Take 1 tablet by mouth daily.   05/23/2024   cetirizine (ZYRTEC) 10 MG chewable tablet Chew 10  mg by mouth daily as needed for allergies.       Review of Systems  Respiratory:  Negative for shortness of breath.   Gastrointestinal:  Negative for abdominal pain, nausea and vomiting.  Genitourinary:  Negative for difficulty urinating, dysuria, vaginal bleeding and vaginal discharge.  Neurological:  Positive for headaches. Negative for dizziness and light-headedness.   Physical Exam   Blood pressure (!) 143/76, pulse 82, temperature 98.1 F (36.7 C), temperature source Oral, resp. rate 16, height 5' 6 (1.676 m), weight 88.9 kg, last menstrual period 09/30/2023, SpO2 97%. Vitals:   05/23/24 2115 05/23/24 2130 05/23/24 2145 05/23/24 2201  BP: (!) 147/88 (!) 143/76 (!) 140/83 136/81    Physical Exam Vitals and nursing note reviewed.  HENT:     Head: Normocephalic and atraumatic.  Eyes:     Conjunctiva/sclera: Conjunctivae normal.  Cardiovascular:     Rate and Rhythm: Normal rate.     Pulses: Normal pulses.  Pulmonary:     Effort: Pulmonary effort is normal. No respiratory distress.  Abdominal:     Comments: Gravid, Appears AGA  Musculoskeletal:  General: Normal range of motion.     Cervical back: Normal range of motion.  Neurological:     Mental Status: She is alert and oriented to person, place, and time.  Psychiatric:        Mood and Affect: Mood normal.        Behavior: Behavior normal.     Fetal Assessment 150 bpm, Mod Var, -Decels, +15x15 Accels Toco: Irregular  MAU Course   Results for orders placed or performed during the hospital encounter of 05/23/24 (from the past 24 hours)  Protein / creatinine ratio, urine     Status: None   Collection Time: 05/23/24  9:11 PM  Result Value Ref Range   Creatinine, Urine 15 mg/dL   Total Protein, Urine <6 mg/dL   Protein Creatinine Ratio        0.00 - 0.15 mg/mg[Cre]  Urinalysis, Routine w reflex microscopic -Urine, Clean Catch     Status: Abnormal   Collection Time: 05/23/24  9:11 PM  Result Value Ref Range    Color, Urine STRAW (A) YELLOW   APPearance CLEAR CLEAR   Specific Gravity, Urine 1.003 (L) 1.005 - 1.030   pH 7.0 5.0 - 8.0   Glucose, UA NEGATIVE NEGATIVE mg/dL   Hgb urine dipstick NEGATIVE NEGATIVE   Bilirubin Urine NEGATIVE NEGATIVE   Ketones, ur NEGATIVE NEGATIVE mg/dL   Protein, ur NEGATIVE NEGATIVE mg/dL   Nitrite NEGATIVE NEGATIVE   Leukocytes,Ua NEGATIVE NEGATIVE  CBC     Status: Abnormal   Collection Time: 05/23/24  9:38 PM  Result Value Ref Range   WBC 10.8 (H) 4.0 - 10.5 K/uL   RBC 4.26 3.87 - 5.11 MIL/uL   Hemoglobin 12.8 12.0 - 15.0 g/dL   HCT 62.1 63.9 - 53.9 %   MCV 88.7 80.0 - 100.0 fL   MCH 30.0 26.0 - 34.0 pg   MCHC 33.9 30.0 - 36.0 g/dL   RDW 86.6 88.4 - 84.4 %   Platelets 160 150 - 400 K/uL   nRBC 0.0 0.0 - 0.2 %  Comprehensive metabolic panel     Status: Abnormal   Collection Time: 05/23/24  9:38 PM  Result Value Ref Range   Sodium 139 135 - 145 mmol/L   Potassium 3.7 3.5 - 5.1 mmol/L   Chloride 107 98 - 111 mmol/L   CO2 20 (L) 22 - 32 mmol/L   Glucose, Bld 89 70 - 99 mg/dL   BUN 6 6 - 20 mg/dL   Creatinine, Ser 9.60 (L) 0.44 - 1.00 mg/dL   Calcium  9.4 8.9 - 10.3 mg/dL   Total Protein 6.1 (L) 6.5 - 8.1 g/dL   Albumin 2.9 (L) 3.5 - 5.0 g/dL   AST 19 15 - 41 U/L   ALT 21 0 - 44 U/L   Alkaline Phosphatase 138 (H) 38 - 126 U/L   Total Bilirubin 0.4 0.0 - 1.2 mg/dL   GFR, Estimated >39 >39 mL/min   Anion gap 12 5 - 15   No results found.   MDM Physical Exam Labs: CBC, CMP, PC Ratio Measure BPQ15 min EFM Pain Management Assessment and Plan   28 year old G1P0  SIUP at 33.6 weeks Cat I FT gHTN  -Labs ordered -Patient requesting fluids and informed fluids okay, but nothing to eat. -Discussed if labs return normal will discharge home and will consider antihypertensives.  -NST reactive. -Await results.   Harlene LITTIE Duncans MSN, CNM 05/23/2024, 9:42 PM   Reassessment (10:37 PM) -Labs return normal. -  Dr . JAYSON. Pickens consulted and advised  contact primary ob regarding starting antihypertensives.  -Dr. DOROTHA Clubs consulted and informed of patient status, evaluation, and results. Requests patient be prescribed Labetalol  100mg  BID.  -Will give initial dose and discharge home. -Patient informed of lab results and recommendation. -Agreeable and without questions.  -Rx for labetalol  sent to pharmacy on file.  -Precautions reviewed.  -Encouraged to call primary office or return to MAU if symptoms worsen or with the onset of new symptoms. -Discharged to home in stable condition.  Harlene LITTIE Duncans MSN, CNM Advanced Practice Provider, [facility]

## 2024-05-29 DIAGNOSIS — Z3A34 34 weeks gestation of pregnancy: Secondary | ICD-10-CM | POA: Diagnosis not present

## 2024-05-29 DIAGNOSIS — Z3400 Encounter for supervision of normal first pregnancy, unspecified trimester: Secondary | ICD-10-CM | POA: Diagnosis not present

## 2024-05-29 DIAGNOSIS — O99891 Other specified diseases and conditions complicating pregnancy: Secondary | ICD-10-CM | POA: Diagnosis not present

## 2024-06-02 DIAGNOSIS — Z3493 Encounter for supervision of normal pregnancy, unspecified, third trimester: Secondary | ICD-10-CM | POA: Diagnosis not present

## 2024-06-02 DIAGNOSIS — Z3A35 35 weeks gestation of pregnancy: Secondary | ICD-10-CM | POA: Diagnosis not present

## 2024-06-04 DIAGNOSIS — Z3403 Encounter for supervision of normal first pregnancy, third trimester: Secondary | ICD-10-CM | POA: Diagnosis not present

## 2024-06-04 DIAGNOSIS — Z3A35 35 weeks gestation of pregnancy: Secondary | ICD-10-CM | POA: Diagnosis not present

## 2024-06-04 DIAGNOSIS — O288 Other abnormal findings on antenatal screening of mother: Secondary | ICD-10-CM | POA: Diagnosis not present

## 2024-06-10 ENCOUNTER — Telehealth (HOSPITAL_COMMUNITY): Payer: Self-pay | Admitting: *Deleted

## 2024-06-10 DIAGNOSIS — Z3685 Encounter for antenatal screening for Streptococcus B: Secondary | ICD-10-CM | POA: Diagnosis not present

## 2024-06-10 DIAGNOSIS — O133 Gestational [pregnancy-induced] hypertension without significant proteinuria, third trimester: Secondary | ICD-10-CM | POA: Diagnosis not present

## 2024-06-10 DIAGNOSIS — Z3400 Encounter for supervision of normal first pregnancy, unspecified trimester: Secondary | ICD-10-CM | POA: Diagnosis not present

## 2024-06-10 DIAGNOSIS — Z3A36 36 weeks gestation of pregnancy: Secondary | ICD-10-CM | POA: Diagnosis not present

## 2024-06-10 LAB — OB RESULTS CONSOLE GBS: GBS: NEGATIVE

## 2024-06-10 NOTE — Telephone Encounter (Signed)
 Preadmission screen

## 2024-06-17 ENCOUNTER — Inpatient Hospital Stay (HOSPITAL_COMMUNITY)
Admission: RE | Admit: 2024-06-17 | Discharge: 2024-06-19 | DRG: 807 | Disposition: A | Discharge status: Home / Self Care | Attending: Obstetrics and Gynecology | Admitting: Obstetrics and Gynecology

## 2024-06-17 ENCOUNTER — Encounter (HOSPITAL_COMMUNITY): Payer: Self-pay | Admitting: Anesthesiology

## 2024-06-17 ENCOUNTER — Inpatient Hospital Stay (HOSPITAL_COMMUNITY)

## 2024-06-17 ENCOUNTER — Encounter (HOSPITAL_COMMUNITY): Payer: Self-pay | Admitting: Obstetrics and Gynecology

## 2024-06-17 ENCOUNTER — Other Ambulatory Visit: Payer: Self-pay

## 2024-06-17 DIAGNOSIS — Z8249 Family history of ischemic heart disease and other diseases of the circulatory system: Secondary | ICD-10-CM

## 2024-06-17 DIAGNOSIS — O134 Gestational [pregnancy-induced] hypertension without significant proteinuria, complicating childbirth: Secondary | ICD-10-CM | POA: Diagnosis not present

## 2024-06-17 DIAGNOSIS — Z23 Encounter for immunization: Secondary | ICD-10-CM | POA: Diagnosis not present

## 2024-06-17 DIAGNOSIS — Z3A37 37 weeks gestation of pregnancy: Secondary | ICD-10-CM | POA: Diagnosis not present

## 2024-06-17 DIAGNOSIS — O43813 Placental infarction, third trimester: Secondary | ICD-10-CM | POA: Diagnosis not present

## 2024-06-17 DIAGNOSIS — O43893 Other placental disorders, third trimester: Secondary | ICD-10-CM | POA: Diagnosis not present

## 2024-06-17 DIAGNOSIS — Z833 Family history of diabetes mellitus: Secondary | ICD-10-CM

## 2024-06-17 DIAGNOSIS — Z87891 Personal history of nicotine dependence: Secondary | ICD-10-CM | POA: Diagnosis not present

## 2024-06-17 DIAGNOSIS — O210 Mild hyperemesis gravidarum: Principal | ICD-10-CM | POA: Diagnosis present

## 2024-06-17 LAB — TYPE AND SCREEN
ABO/RH(D): O POS
Antibody Screen: NEGATIVE

## 2024-06-17 LAB — CBC WITH DIFFERENTIAL/PLATELET
Abs Immature Granulocytes: 0.04 K/uL (ref 0.00–0.07)
Basophils Absolute: 0 K/uL (ref 0.0–0.1)
Basophils Relative: 0 %
Eosinophils Absolute: 0.1 K/uL (ref 0.0–0.5)
Eosinophils Relative: 0 %
HCT: 39.4 % (ref 36.0–46.0)
Hemoglobin: 13.4 g/dL (ref 12.0–15.0)
Immature Granulocytes: 0 %
Lymphocytes Relative: 17 %
Lymphs Abs: 2.1 K/uL (ref 0.7–4.0)
MCH: 29.6 pg (ref 26.0–34.0)
MCHC: 34 g/dL (ref 30.0–36.0)
MCV: 87 fL (ref 80.0–100.0)
Monocytes Absolute: 0.9 K/uL (ref 0.1–1.0)
Monocytes Relative: 7 %
Neutro Abs: 9.2 K/uL — ABNORMAL HIGH (ref 1.7–7.7)
Neutrophils Relative %: 76 %
Platelets: 178 K/uL (ref 150–400)
RBC: 4.53 MIL/uL (ref 3.87–5.11)
RDW: 13.2 % (ref 11.5–15.5)
WBC: 12.2 K/uL — ABNORMAL HIGH (ref 4.0–10.5)
nRBC: 0 % (ref 0.0–0.2)

## 2024-06-17 LAB — COMPREHENSIVE METABOLIC PANEL WITH GFR
ALT: 20 U/L (ref 0–44)
AST: 24 U/L (ref 15–41)
Albumin: 2.8 g/dL — ABNORMAL LOW (ref 3.5–5.0)
Alkaline Phosphatase: 161 U/L — ABNORMAL HIGH (ref 38–126)
Anion gap: 9 (ref 5–15)
BUN: 6 mg/dL (ref 6–20)
CO2: 19 mmol/L — ABNORMAL LOW (ref 22–32)
Calcium: 9.4 mg/dL (ref 8.9–10.3)
Chloride: 106 mmol/L (ref 98–111)
Creatinine, Ser: 0.47 mg/dL (ref 0.44–1.00)
GFR, Estimated: >60 mL/min (ref >60)
Glucose, Bld: 88 mg/dL (ref 70–99)
Potassium: 3.7 mmol/L (ref 3.5–5.1)
Sodium: 134 mmol/L — ABNORMAL LOW (ref 135–145)
Total Bilirubin: 0.4 mg/dL (ref 0.0–1.2)
Total Protein: 6.3 g/dL — ABNORMAL LOW (ref 6.5–8.1)

## 2024-06-17 LAB — CBC
HCT: 38.1 % (ref 36.0–46.0)
Hemoglobin: 13 g/dL (ref 12.0–15.0)
MCH: 29.7 pg (ref 26.0–34.0)
MCHC: 34.1 g/dL (ref 30.0–36.0)
MCV: 87 fL (ref 80.0–100.0)
Platelets: 172 K/uL (ref 150–400)
RBC: 4.38 MIL/uL (ref 3.87–5.11)
RDW: 13.2 % (ref 11.5–15.5)
WBC: 9.2 K/uL (ref 4.0–10.5)
nRBC: 0 % (ref 0.0–0.2)

## 2024-06-17 LAB — HIV ANTIBODY (ROUTINE TESTING W REFLEX): HIV Screen 4th Generation wRfx: NONREACTIVE

## 2024-06-17 LAB — PROTEIN / CREATININE RATIO, URINE
Creatinine, Urine: 41 mg/dL
Protein Creatinine Ratio: 0.34 mg/mg{creat} — ABNORMAL HIGH (ref 0.00–0.15)
Total Protein, Urine: 14 mg/dL

## 2024-06-17 LAB — RPR: RPR Ser Ql: NONREACTIVE

## 2024-06-17 MED ORDER — LIDOCAINE HCL (PF) 1 % IJ SOLN
30.0000 mL | INTRAMUSCULAR | Status: AC | PRN
  Administered 2024-06-17: 30 mL via SUBCUTANEOUS
  Filled 2024-06-17: qty 30

## 2024-06-17 MED ORDER — OXYTOCIN-SODIUM CHLORIDE 30-0.9 UT/500ML-% IV SOLN
1.0000 m[IU]/min | INTRAVENOUS | Status: DC
  Administered 2024-06-17: 2 m[IU]/min via INTRAVENOUS

## 2024-06-17 MED ORDER — BENZOCAINE-MENTHOL 20-0.5 % EX AERO
1.0000 | INHALATION_SPRAY | CUTANEOUS | Status: DC | PRN
Start: 2024-06-17 — End: 2024-06-19

## 2024-06-17 MED ORDER — ONDANSETRON HCL 4 MG/2ML IJ SOLN: 4.0000 mg | Freq: Four times a day (QID) | INTRAMUSCULAR | Status: DC | PRN

## 2024-06-17 MED ORDER — DIBUCAINE (PERIANAL) 1 % EX OINT: 1.0000 | TOPICAL_OINTMENT | CUTANEOUS | Status: DC | PRN

## 2024-06-17 MED ORDER — LACTATED RINGERS IV SOLN: INTRAVENOUS | Status: DC

## 2024-06-17 MED ORDER — DIPHENHYDRAMINE HCL 25 MG PO CAPS
25.0000 mg | ORAL_CAPSULE | Freq: Four times a day (QID) | ORAL | Status: DC | PRN
Start: 2024-06-17 — End: 2024-06-19

## 2024-06-17 MED ORDER — EPHEDRINE 5 MG/ML INJ: 10.0000 mg | INTRAVENOUS | Status: DC | PRN

## 2024-06-17 MED ORDER — ONDANSETRON HCL 4 MG PO TABS: 4.0000 mg | ORAL_TABLET | ORAL | Status: DC | PRN

## 2024-06-17 MED ORDER — FENTANYL CITRATE (PF) 100 MCG/2ML IJ SOLN
50.0000 ug | INTRAMUSCULAR | Status: DC | PRN
  Administered 2024-06-17: 50 ug via INTRAVENOUS
  Administered 2024-06-17 (×3): 100 ug via INTRAVENOUS
  Filled 2024-06-17 (×4): qty 2

## 2024-06-17 MED ORDER — SIMETHICONE 80 MG PO CHEW
80.0000 mg | CHEWABLE_TABLET | ORAL | Status: DC | PRN
Start: 2024-06-17 — End: 2024-06-19

## 2024-06-17 MED ORDER — SENNOSIDES-DOCUSATE SODIUM 8.6-50 MG PO TABS
2.0000 | ORAL_TABLET | ORAL | Status: DC
  Administered 2024-06-17 – 2024-06-18 (×2): 2 via ORAL
  Filled 2024-06-17 (×2): qty 2

## 2024-06-17 MED ORDER — MISOPROSTOL 25 MCG QUARTER TABLET
25.0000 ug | ORAL_TABLET | ORAL | Status: DC | PRN
  Administered 2024-06-17: 25 ug via VAGINAL
  Filled 2024-06-17 (×2): qty 1

## 2024-06-17 MED ORDER — PRENATAL MULTIVITAMIN CH
1.0000 | ORAL_TABLET | Freq: Every day | ORAL | Status: DC
  Administered 2024-06-18: 1 via ORAL
  Filled 2024-06-17: qty 1

## 2024-06-17 MED ORDER — OXYCODONE-ACETAMINOPHEN 5-325 MG PO TABS: 1.0000 | ORAL_TABLET | ORAL | Status: DC | PRN

## 2024-06-17 MED ORDER — TERBUTALINE SULFATE 1 MG/ML IJ SOLN: 0.2500 mg | Freq: Once | INTRAMUSCULAR | Status: DC | PRN

## 2024-06-17 MED ORDER — ZOLPIDEM TARTRATE 5 MG PO TABS: 5.0000 mg | ORAL_TABLET | Freq: Every evening | ORAL | Status: DC | PRN

## 2024-06-17 MED ORDER — CEFAZOLIN SODIUM-DEXTROSE 2-4 GM/100ML-% IV SOLN: 2.0000 g | Freq: Once | INTRAVENOUS | Status: DC

## 2024-06-17 MED ORDER — SOD CITRATE-CITRIC ACID 500-334 MG/5ML PO SOLN: 30.0000 mL | ORAL | Status: DC | PRN

## 2024-06-17 MED ORDER — OXYTOCIN BOLUS FROM INFUSION
333.0000 mL | Freq: Once | INTRAVENOUS | Status: AC
  Administered 2024-06-17: 333 mL via INTRAVENOUS

## 2024-06-17 MED ORDER — COCONUT OIL OIL
1.0000 | TOPICAL_OIL | Status: DC | PRN
Start: 2024-06-17 — End: 2024-06-19

## 2024-06-17 MED ORDER — LACTATED RINGERS IV SOLN
500.0000 mL | Freq: Once | INTRAVENOUS | Status: AC
  Administered 2024-06-17: 500 mL via INTRAVENOUS

## 2024-06-17 MED ORDER — PHENYLEPHRINE 80 MCG/ML (10ML) SYRINGE FOR IV PUSH (FOR BLOOD PRESSURE SUPPORT): 80.0000 ug | PREFILLED_SYRINGE | INTRAVENOUS | Status: DC | PRN

## 2024-06-17 MED ORDER — WITCH HAZEL-GLYCERIN EX PADS: 1.0000 | MEDICATED_PAD | CUTANEOUS | Status: DC | PRN

## 2024-06-17 MED ORDER — DIPHENHYDRAMINE HCL 50 MG/ML IJ SOLN: 12.5000 mg | INTRAMUSCULAR | Status: DC | PRN

## 2024-06-17 MED ORDER — ACETAMINOPHEN 325 MG PO TABS: 650.0000 mg | ORAL_TABLET | ORAL | Status: DC | PRN

## 2024-06-17 MED ORDER — TETANUS-DIPHTH-ACELL PERTUSSIS 5-2.5-18.5 LF-MCG/0.5 IM SUSY: 0.5000 mL | PREFILLED_SYRINGE | Freq: Once | INTRAMUSCULAR | Status: DC

## 2024-06-17 MED ORDER — LACTATED RINGERS IV SOLN
500.0000 mL | INTRAVENOUS | Status: DC | PRN
  Administered 2024-06-17: 500 mL via INTRAVENOUS

## 2024-06-17 MED ORDER — IBUPROFEN 600 MG PO TABS
600.0000 mg | ORAL_TABLET | Freq: Four times a day (QID) | ORAL | Status: DC
  Administered 2024-06-17 – 2024-06-19 (×7): 600 mg via ORAL
  Filled 2024-06-17 (×7): qty 1

## 2024-06-17 MED ORDER — OXYCODONE-ACETAMINOPHEN 5-325 MG PO TABS: 2.0000 | ORAL_TABLET | ORAL | Status: DC | PRN

## 2024-06-17 MED ORDER — OXYTOCIN-SODIUM CHLORIDE 30-0.9 UT/500ML-% IV SOLN
2.5000 [IU]/h | INTRAVENOUS | Status: DC
  Administered 2024-06-17: 2.5 [IU]/h via INTRAVENOUS
  Filled 2024-06-17 (×2): qty 500

## 2024-06-17 MED ORDER — FENTANYL-BUPIVACAINE-NACL 0.5-0.125-0.9 MG/250ML-% EP SOLN
12.0000 mL/h | EPIDURAL | Status: DC | PRN
  Filled 2024-06-17: qty 250

## 2024-06-17 MED ORDER — ONDANSETRON HCL 4 MG/2ML IJ SOLN: 4.0000 mg | INTRAMUSCULAR | Status: DC | PRN

## 2024-06-17 MED ORDER — HYDROXYZINE HCL 50 MG PO TABS: 50.0000 mg | ORAL_TABLET | Freq: Four times a day (QID) | ORAL | Status: DC | PRN

## 2024-06-17 MED ORDER — OXYCODONE HCL 5 MG PO TABS: 5.0000 mg | ORAL_TABLET | ORAL | Status: DC | PRN

## 2024-06-17 NOTE — Plan of Care (Signed)

## 2024-06-17 NOTE — Progress Notes (Signed)
 Labor Progress Note  Cervix unchanged at tight 2 cm / 90% / -2. Head well cpplied.  AROM completed in standard fashion with return of clear fluid.    FHT cat 1 with accels.   Continue IOL  Nicole Mcdaniel

## 2024-06-17 NOTE — H&P (Signed)
 OB History and Physical   Nicole Mcdaniel is a 28 y.o. female G1P0 presenting for IOL at [redacted]w[redacted]d for gestational hypertension.   She has been on labetalol  100 mg BID with improved control of BP. She denies headache, vision changes, RUQ or epigastric pain.   Pregnancy course has been uncomplicated otherwise. Most recent US  at 36 weeks: **Vertex**AFI= 11.9cm= 35 %tile**EFW= 2645g (5'13oz)= 26 %tile**  Rh positive, GBS negative, panorama low risk.    OB History     Gravida  1   Para      Term      Preterm      AB      Living         SAB      IAB      Ectopic      Multiple      Live Births             Past Medical History:  Diagnosis Date   Medical history non-contributory    Past Surgical History:  Procedure Laterality Date   WISDOM TOOTH EXTRACTION     Family History: family history includes ALS in her paternal grandmother; COPD in her paternal grandfather; Cancer in her maternal aunt; Diabetes in her father, maternal grandfather, maternal grandmother, paternal grandfather, and paternal grandmother; Heart disease in her maternal grandfather, maternal grandmother, paternal grandfather, and paternal grandmother; Hyperlipidemia in her mother; Hypothyroidism in her maternal grandmother. Social History:  reports that she quit smoking about 7 months ago. Her smoking use included cigarettes. She has never used smokeless tobacco. She reports that she does not currently use alcohol. She reports that she does not use drugs.     Maternal Diabetes: No Genetic Screening: Normal Maternal Ultrasounds/Referrals: Normal Fetal Ultrasounds or other Referrals:  None Maternal Substance Abuse:  No Significant Maternal Medications:  Labetalol  Significant Maternal Lab Results:  Group B Strep negative Other Comments:  None  Review of Systems - Patient denies fever, chills, SOB, CP, N/V/D.  History Dilation: Closed Exam by:: Smalley RN Blood pressure 116/87, pulse 78,  temperature 97.9 F (36.6 C), temperature source Oral, resp. rate 15, height 5' 6 (1.676 m), weight 92.1 kg, last menstrual period 09/30/2023, SpO2 97%. Exam Physical Exam   Gen: alert, well appearing, no distress Chest: nonlabored breathing CV: no peripheral edema Abdomen: soft, gravid  Ext: no evidence of DVT  Prenatal labs: ABO, Rh: --/--/O POS (08/26 9493) Antibody: NEG (08/26 0506) Rubella: Nonimmune (02/04 0000) RPR: Nonreactive (02/04 0000)  HBsAg: Negative (02/04 0000)  HIV: Non-reactive (02/04 0000)  GBS: Negative/-- (08/19 0000)   Assessment/Plan: Admit to Labor and Delivery Cytotec  for cervical ripening, followed by pitocin , AROM.  She is s/p 1x cytotec  Epidural when desired  P/C ratio this AM 0.34, diagnostic of preeclampsia. No severe features present.  FHT cat 1 BP well controlled on admission, will continue labetalol  100 mg BID if necessary  Nicole Mcdaniel 06/17/2024, 8:49 AM

## 2024-06-18 LAB — CBC
HCT: 32.8 % — ABNORMAL LOW (ref 36.0–46.0)
Hemoglobin: 11.1 g/dL — ABNORMAL LOW (ref 12.0–15.0)
MCH: 30.1 pg (ref 26.0–34.0)
MCHC: 33.8 g/dL (ref 30.0–36.0)
MCV: 88.9 fL (ref 80.0–100.0)
Platelets: 153 K/uL (ref 150–400)
RBC: 3.69 MIL/uL — ABNORMAL LOW (ref 3.87–5.11)
RDW: 13.2 % (ref 11.5–15.5)
WBC: 14.1 K/uL — ABNORMAL HIGH (ref 4.0–10.5)
nRBC: 0 % (ref 0.0–0.2)

## 2024-06-18 NOTE — Progress Notes (Signed)
 PPD # 1  Doing well. Bleeding and cramping are normal.  BP 135/62 (BP Location: Left Arm)   Pulse 79   Temp 98.4 F (36.9 C) (Oral)   Resp 18   Ht 5' 6 (1.676 m)   Wt 92.1 kg   LMP 09/30/2023   SpO2 97%   Breastfeeding Unknown   BMI 32.77 kg/m  Results for orders placed or performed during the hospital encounter of 06/17/24 (from the past 24 hours)  CBC with Differential/Platelet     Status: Abnormal   Collection Time: 06/17/24  3:53 PM  Result Value Ref Range   WBC 12.2 (H) 4.0 - 10.5 K/uL   RBC 4.53 3.87 - 5.11 MIL/uL   Hemoglobin 13.4 12.0 - 15.0 g/dL   HCT 60.5 63.9 - 53.9 %   MCV 87.0 80.0 - 100.0 fL   MCH 29.6 26.0 - 34.0 pg   MCHC 34.0 30.0 - 36.0 g/dL   RDW 86.7 88.4 - 84.4 %   Platelets 178 150 - 400 K/uL   nRBC 0.0 0.0 - 0.2 %   Neutrophils Relative % 76 %   Neutro Abs 9.2 (H) 1.7 - 7.7 K/uL   Lymphocytes Relative 17 %   Lymphs Abs 2.1 0.7 - 4.0 K/uL   Monocytes Relative 7 %   Monocytes Absolute 0.9 0.1 - 1.0 K/uL   Eosinophils Relative 0 %   Eosinophils Absolute 0.1 0.0 - 0.5 K/uL   Basophils Relative 0 %   Basophils Absolute 0.0 0.0 - 0.1 K/uL   Immature Granulocytes 0 %   Abs Immature Granulocytes 0.04 0.00 - 0.07 K/uL  CBC     Status: Abnormal   Collection Time: 06/18/24  4:28 AM  Result Value Ref Range   WBC 14.1 (H) 4.0 - 10.5 K/uL   RBC 3.69 (L) 3.87 - 5.11 MIL/uL   Hemoglobin 11.1 (L) 12.0 - 15.0 g/dL   HCT 67.1 (L) 63.9 - 53.9 %   MCV 88.9 80.0 - 100.0 fL   MCH 30.1 26.0 - 34.0 pg   MCHC 33.8 30.0 - 36.0 g/dL   RDW 86.7 88.4 - 84.4 %   Platelets 153 150 - 400 K/uL   nRBC 0.0 0.0 - 0.2 %   Abdomen is soft and non tender  Lochia WNL  PPD # 1  Doing well  Routine care  Circ today - discussed with parents Discharge home tomorrow

## 2024-06-18 NOTE — Lactation Note (Signed)
 This note was copied from a baby's chart. Lactation Consultation Note  Patient Name: Nicole Mcdaniel Unijb'd Date: 06/18/2024 Age:29 hours Reason for consult: Mother's request;Initial assessment;Primapara;1st time breastfeeding;Early term 37-38.6wks  P1- MOB called out for consult now that she is out of the shower. MOB reports that breastfeeding has been going well and she has no concerns at this time. MOB denies any painful latching, but reports tender nipples due to her body not being used to infant nursing. Infant had recently fed, so LC encouraged calling out for a feeding. LC flange sized MOB for her home pump. LC sized MOB at a 19 mm for the right nipple and a 21 mm for the left nipple due to it's length. MOB denies having any questions or concerns at this time.  LC reviewed the first 24 hr birthday nap, day 2 cluster feeding, feeding infant on cue 8-12x in 24 hrs, not allowing infant to go over 3 hrs without a feeding, CDC milk storage guidelines, LC services handout and engorgement/breast care. LC encouraged MOB to call for further assistance as needed.  Maternal Data Has patient been taught Hand Expression?: Yes Does the patient have breastfeeding experience prior to this delivery?: No  Feeding Mother's Current Feeding Choice: Breast Milk  Lactation Tools Discussed/Used Tools: Flanges Flange Size: 18;21 (18mm on right breast and 21mm on left) Pump Education: Milk Storage  Interventions Interventions: Breast feeding basics reviewed;Education;LC Services brochure  Discharge Discharge Education: Engorgement and breast care;Warning signs for feeding baby Pump: DEBP;Personal (Spectra )  Consult Status Consult Status: Follow-up Date: 06/19/24 Follow-up type: In-patient    Recardo Hoit BS, IBCLC 06/18/2024, 5:05 PM

## 2024-06-18 NOTE — Lactation Note (Signed)
 This note was copied from a baby's chart. Lactation Consultation Note  Patient Name: Nicole Mcdaniel Unijb'd Date: 06/18/2024 Age:29 hours Reason for consult: Initial assessment;Mother's request;Primapara;1st time breastfeeding;Early term 37-38.6wks  P1- MOB called out for a consult, but she was in the shower when [facility] entered the room. FOB expressed concern at infant has not fed almost at all since his circumcision this morning. LC reassured FOB that this is normal behavior after a circ. LC reviewed cluster feeding and what that will look like tonight. FOB expressed that despite the poor feedings today, infant has been nursing great. LC encouraged FOB to have MOB call out when she is done with the shower.  Maternal Data Does the patient have breastfeeding experience prior to this delivery?: No  Feeding Mother's Current Feeding Choice: Breast Milk  Lactation Tools Discussed/Used Pump Education: Milk Storage  Interventions Interventions: Education;LC Services brochure  Discharge Discharge Education: Warning signs for feeding baby  Consult Status Consult Status: Follow-up Date: 06/19/24 Follow-up type: In-patient    Recardo Hoit BS, IBCLC 06/18/2024, 4:36 PM

## 2024-06-19 LAB — SURGICAL PATHOLOGY

## 2024-06-19 MED ORDER — MEASLES, MUMPS & RUBELLA VAC IJ SOLR
0.5000 mL | Freq: Once | INTRAMUSCULAR | Status: AC
  Administered 2024-06-19: 0.5 mL via SUBCUTANEOUS
  Filled 2024-06-19: qty 0.5

## 2024-06-19 NOTE — Discharge Summary (Signed)
 Postpartum Discharge Summary     Patient Name: Nicole Mcdaniel DOB: [birth year] MRN: [id number]  Date of admission: 06/17/2024 Delivery date:06/17/2024 Delivering provider: LEQUITA LYE A Date of discharge: 06/19/2024  Admitting diagnosis: gestational HTN Intrauterine pregnancy: [redacted]w[redacted]d     Secondary diagnosis:  Principal Problem:   Hyperemesis affecting pregnancy, antepartum  Additional problems: None    Discharge diagnosis: Term Pregnancy Delivered and Gestational Hypertension                                              Post partum procedures:None Augmentation: AROM, Pitocin , and Cytotec  Complications: None  Hospital course: Induction of Labor With Vaginal Delivery   28 y.o. yo G1P1001 at [redacted]w[redacted]d was admitted to the hospital 06/17/2024 for induction of labor.  Indication for induction: Gestational hypertension.  Patient had an labor course complicated by none Membrane Rupture Time/Date: 1:26 PM,06/17/2024  Delivery Method:Vaginal, Spontaneous Operative Delivery:N/A Episiotomy: None Lacerations:  2nd degree;Perineal Details of delivery can be found in separate delivery note.  Patient had a postpartum course complicated retained placenta requiring manual extraction without other issues. Patient is discharged home 06/19/24. Labetalol  was stopped s/s normotension.   Newborn Data: Birth date:06/17/2024 Birth time:5:10 PM Gender:Female Living status:Living Apgars:8 ,9  Weight:2930 g  Magnesium Sulfate received: No BMZ received: No Rhophylac:N/A MMR:Yes  Immunizations administered: Immunization History  Administered Date(s) Administered   HPV Quadrivalent 07/05/2012, 09/06/2012, 01/03/2013   Influenza,inj,Quad PF,6+ Mos 09/24/2015, 08/27/2018, 08/06/2019   Rabies, IM 08/01/2021, 08/08/2021   Tdap 06/03/2012, 02/05/2017    Physical exam  Vitals:   06/18/24 0455 06/18/24 0812 06/18/24 2012 06/19/24 0513  BP: 135/62 138/73 (!) 145/74 132/86  Pulse: 79 68 94 87  Resp: 18  18 18 18   Temp: 98.4 F (36.9 C) 97.6 F (36.4 C) 98.4 F (36.9 C) 98.3 F (36.8 C)  TempSrc: Oral Oral Oral Oral  SpO2: 97% 96% 99% 98%  Weight:      Height:       General: alert, cooperative, and no distress Lochia: appropriate Uterine Fundus: firm Incision: N/A DVT Evaluation: No evidence of DVT seen on physical exam. Labs: Lab Results  Component Value Date   WBC 14.1 (H) 06/18/2024   HGB 11.1 (L) 06/18/2024   HCT 32.8 (L) 06/18/2024   MCV 88.9 06/18/2024   PLT 153 06/18/2024      Latest Ref Rng & Units 06/17/2024    4:25 AM  CMP  Glucose 70 - 99 mg/dL 88   BUN 6 - 20 mg/dL 6   Creatinine 9.55 - 8.99 mg/dL 9.52   Sodium 864 - 854 mmol/L 134   Potassium 3.5 - 5.1 mmol/L 3.7   Chloride 98 - 111 mmol/L 106   CO2 22 - 32 mmol/L 19   Calcium  8.9 - 10.3 mg/dL 9.4   Total Protein 6.5 - 8.1 g/dL 6.3   Total Bilirubin 0.0 - 1.2 mg/dL 0.4   Alkaline Phos 38 - 126 U/L 161   AST 15 - 41 U/L 24   ALT 0 - 44 U/L 20    Edinburgh Score:    06/18/2024    7:53 AM  Edinburgh Postnatal Depression Scale Screening Tool  I have been able to laugh and see the funny side of things. 0  I have looked forward with enjoyment to things. 0  I have blamed myself unnecessarily when  things went wrong. 1  I have been anxious or worried for no good reason. 2  I have felt scared or panicky for no good reason. 1  Things have been getting on top of me. 1  I have been so unhappy that I have had difficulty sleeping. 0  I have felt sad or miserable. 0  I have been so unhappy that I have been crying. 0  The thought of harming myself has occurred to me. 0  Edinburgh Postnatal Depression Scale Total 5      After visit meds:  Allergies as of 06/19/2024   No Known Allergies      Medication List     STOP taking these medications    labetalol  100 MG tablet Commonly known as: NORMODYNE        TAKE these medications    cetirizine 10 MG chewable tablet Commonly known as: ZYRTEC Chew 10  mg by mouth daily as needed for allergies.   PRENATAL VITAMIN PO Take 1 tablet by mouth daily.         Discharge home in stable condition Infant Feeding: Bottle and Breast Infant Disposition:home with mother Discharge instruction: per After Visit Summary and Postpartum booklet. Activity: Advance as tolerated. Pelvic rest for 6 weeks.  Diet: routine diet Anticipated Birth Control: Unsure Postpartum Appointment:6 weeks Additional Postpartum F/U: BP check 2-3 days Future Appointments:No future appointments. Follow up Visit:      06/19/2024 Kelly Delon Milian, MD

## 2024-06-19 NOTE — Lactation Note (Addendum)
 This note was copied from a baby's chart. Lactation Consultation Note  Patient Name: Nicole Mcdaniel Unijb'd Date: 06/19/2024 Age:29 hours Reason for consult: Follow-up assessment;Maternal discharge;1st time breastfeeding;Primapara;Early term 37-38.6wks  P1, 37 wks, @ 39 hrs of life. Baby cluster fed this morning. Encouraged mom that is totally expected- brings in milk supply. Feeding goals of 8-12 x /day now, feedings getting longer in duration. Encouraged starting with hand expression, and use of breast compression through feeding,  and offering both breasts each feed are the best tools to fight initial weight loss- normal 7-10% on discharge day. Coconut oil and hand pump provided. Encouraged mom to keep working on big mouth latch with baby and use EBM or coconut oil after each feed. Discussed cluster feeding overnight/ early morning brings in our milk supply, shared expectations of milk coming in. Highlighted risk of engorgement. Discussed hand pump/express to soften breasts, motrin  as anti-inflammatory, and ice packs for 10-20 minutes post feed/pumping if still over-full is the best treatments for inflamed/engorged breasts.   Maternal Data Has patient been taught Hand Expression?: Yes Does the patient have breastfeeding experience prior to this delivery?: No  Feeding Mother's Current Feeding Choice: Breast Milk   Lactation Tools Discussed/Used Tools: Coconut oil Pump Education: Milk Storage  Interventions Interventions: Breast feeding basics reviewed;Hand express;Breast compression;Expressed milk;Hand pump;DEBP;Education;LC Services brochure;CDC milk storage guidelines  Discharge Discharge Education: Engorgement and breast care Pump: Manual;DEBP;Personal (Per mom has a Spectra  and a Medela hand pump)  Consult Status Consult Status: Complete Date: 06/19/24 Follow-up type: In-patient    Nicole Mcdaniel 06/19/2024, 8:40 AM

## 2024-06-28 ENCOUNTER — Telehealth (HOSPITAL_COMMUNITY): Payer: Self-pay

## 2024-06-28 NOTE — Telephone Encounter (Signed)
 06/28/2024 1332  Name: Nicole Mcdaniel MRN: [id number] DOB: [birth year]  Reason for Call:  Transition of Care Hospital Discharge Call  Contact Status: Patient Contact Status: Complete  Language assistant needed:          Follow-Up Questions: Do You Have Any Concerns About Your Health As You Heal From Delivery?: No Do You Have Any Concerns About Your Infants Health?: No  Edinburgh Postnatal Depression Scale:  In the Past 7 Days: I have been able to laugh and see the funny side of things.: As much as I always could I have looked forward with enjoyment to things.: As much as I ever did I have blamed myself unnecessarily when things went wrong.: Not very often I have been anxious or worried for no good reason.: No, not at all I have felt scared or panicky for no good reason.: No, not much Things have been getting on top of me.: No, I have been coping as well as ever I have been so unhappy that I have had difficulty sleeping.: Not at all I have felt sad or miserable.: No, not at all I have been so unhappy that I have been crying.: No, never The thought of harming myself has occurred to me.: Never Edinburgh Postnatal Depression Scale Total: 2  PHQ2-9 Depression Scale:     Discharge Follow-up: Edinburgh score requires follow up?: No Patient was advised of the following resources:: Breastfeeding Support Group, Support Group  Post-discharge interventions: Reviewed Newborn Safe Sleep Practices  Signature  Rosaline Deretha PEAK

## 2024-06-30 ENCOUNTER — Inpatient Hospital Stay (HOSPITAL_COMMUNITY)
Admission: AD | Admit: 2024-06-30 | Discharge: 2024-06-30 | Disposition: A | Discharge status: Home / Self Care | Attending: Obstetrics and Gynecology | Admitting: Obstetrics and Gynecology

## 2024-06-30 DIAGNOSIS — Z8759 Personal history of other complications of pregnancy, childbirth and the puerperium: Secondary | ICD-10-CM | POA: Diagnosis not present

## 2024-06-30 DIAGNOSIS — O139 Gestational [pregnancy-induced] hypertension without significant proteinuria, unspecified trimester: Secondary | ICD-10-CM

## 2024-06-30 DIAGNOSIS — O135 Gestational [pregnancy-induced] hypertension without significant proteinuria, complicating the puerperium: Secondary | ICD-10-CM | POA: Diagnosis not present

## 2024-06-30 DIAGNOSIS — R519 Headache, unspecified: Secondary | ICD-10-CM

## 2024-06-30 DIAGNOSIS — R8290 Unspecified abnormal findings in urine: Secondary | ICD-10-CM | POA: Diagnosis not present

## 2024-06-30 DIAGNOSIS — R030 Elevated blood-pressure reading, without diagnosis of hypertension: Secondary | ICD-10-CM | POA: Diagnosis not present

## 2024-06-30 DIAGNOSIS — Z01419 Encounter for gynecological examination (general) (routine) without abnormal findings: Secondary | ICD-10-CM | POA: Diagnosis not present

## 2024-06-30 LAB — COMPREHENSIVE METABOLIC PANEL WITH GFR
ALT: 23 U/L (ref 0–44)
AST: 20 U/L (ref 15–41)
Albumin: 3.5 g/dL (ref 3.5–5.0)
Alkaline Phosphatase: 114 U/L (ref 38–126)
Anion gap: 14 (ref 5–15)
BUN: 7 mg/dL (ref 6–20)
CO2: 22 mmol/L (ref 22–32)
Calcium: 9.1 mg/dL (ref 8.9–10.3)
Chloride: 104 mmol/L (ref 98–111)
Creatinine, Ser: 0.51 mg/dL (ref 0.44–1.00)
GFR, Estimated: >60 mL/min (ref >60)
Glucose, Bld: 83 mg/dL (ref 70–99)
Potassium: 4 mmol/L (ref 3.5–5.1)
Sodium: 140 mmol/L (ref 135–145)
Total Bilirubin: 0.7 mg/dL (ref 0.0–1.2)
Total Protein: 6.8 g/dL (ref 6.5–8.1)

## 2024-06-30 LAB — CBC
HCT: 42.4 % (ref 36.0–46.0)
Hemoglobin: 13.8 g/dL (ref 12.0–15.0)
MCH: 29.2 pg (ref 26.0–34.0)
MCHC: 32.5 g/dL (ref 30.0–36.0)
MCV: 89.6 fL (ref 80.0–100.0)
Platelets: 274 K/uL (ref 150–400)
RBC: 4.73 MIL/uL (ref 3.87–5.11)
RDW: 12.7 % (ref 11.5–15.5)
WBC: 6.3 K/uL (ref 4.0–10.5)
nRBC: 0 % (ref 0.0–0.2)

## 2024-06-30 MED ORDER — METOCLOPRAMIDE HCL 10 MG PO TABS
10.0000 mg | ORAL_TABLET | Freq: Once | ORAL | Status: AC
  Administered 2024-06-30: 10 mg via ORAL
  Filled 2024-06-30: qty 1

## 2024-06-30 MED ORDER — CYCLOBENZAPRINE HCL 10 MG PO TABS: 10.0000 mg | ORAL_TABLET | Freq: Two times a day (BID) | ORAL | 0 refills | Status: AC | PRN

## 2024-06-30 MED ORDER — LABETALOL HCL 200 MG PO TABS: 300.0000 mg | ORAL_TABLET | Freq: Three times a day (TID) | ORAL | 0 refills | Status: AC

## 2024-06-30 MED ORDER — CYCLOBENZAPRINE HCL 5 MG PO TABS
10.0000 mg | ORAL_TABLET | Freq: Once | ORAL | Status: AC
  Administered 2024-06-30: 10 mg via ORAL
  Filled 2024-06-30: qty 2

## 2024-06-30 MED ORDER — ACETAMINOPHEN 500 MG PO TABS
1000.0000 mg | ORAL_TABLET | Freq: Once | ORAL | Status: AC
  Administered 2024-06-30: 1000 mg via ORAL
  Filled 2024-06-30: qty 2

## 2024-06-30 MED ORDER — METOCLOPRAMIDE HCL 10 MG PO TABS: 10.0000 mg | ORAL_TABLET | Freq: Three times a day (TID) | ORAL | 0 refills | Status: AC | PRN

## 2024-06-30 NOTE — MAU Note (Signed)
 Nicole Mcdaniel is a 28 y.o. here in MAU reporting: vag delivery 8/26.was induced for BP.  States was told while in hosp she was pre-eclamptic.  Was on BP medication prior to delivery.  Still taking.  HA started last Wed, wakes up every morning with a HA.  Only taking Ibuprofen  if she takes anything- seems to help- last was on Sat. Denies epigastric pain, visual changes or swelling. Reports no excessive bleeding, is less than periods.  Baby is doing well, breast feeding is going well.   Onset of complaint: last Wed. Pain score: HA 3  Vitals:   06/30/24 1231  BP: (!) 142/94  Pulse: 70  Resp: 16  Temp: 98.5 F (36.9 C)  SpO2: 98%      Lab orders placed from triage:

## 2024-06-30 NOTE — MAU Provider Note (Signed)
 Event Date/Time   First Provider Initiated Contact with Patient 06/30/24 1306    Postpartum D#13     S [name] is a 28 y.o. G1P1001 patient who presents to MAU today with complaint of she is status post vaginal delivery on 06/17/2024 induced for gestational hypertension.  Patient was discharged home on labetalol  which she reports she is still taking she says she was last seen in the office and/or spoke with a nurse on 06/29/2024 and was told to increase her labetalol  to 200 mg 3 times daily due to an unresolved headache and mildly elevated blood pressures at home.  Patient denies any shortness of breath, visual changes, right upper quadrant pain, chest pain, or visual changes.  She has only been taking ibuprofen  for headache reports that is currently 3 out of 10 on pain scale.  She is currently breast-feeding and baby is doing well.  Remainder of ROS negative unless noted in HPI  O BP (!) 133/91   Pulse 70   Temp 98.6 F (37 C) (Oral)   Resp 16   SpO2 98%   Breastfeeding Yes   Vitals:   06/30/24 1231 06/30/24 1247 06/30/24 1300  BP: (!) 142/94 (!) 141/83 (!) 133/91    Physical Exam Vitals and nursing note reviewed.  Constitutional:      General: She is not in acute distress.    Appearance: Normal appearance. She is not ill-appearing.  HENT:     Head: Normocephalic.     Nose: Nose normal.     Mouth/Throat:     Mouth: Mucous membranes are moist.  Cardiovascular:     Rate and Rhythm: Normal rate.  Pulmonary:     Effort: Pulmonary effort is normal.  Abdominal:     Palpations: Abdomen is soft.  Musculoskeletal:        General: Normal range of motion.     Cervical back: Normal range of motion.  Skin:    General: Skin is warm.  Neurological:     Mental Status: She is alert and oriented to person, place, and time.  Psychiatric:        Mood and Affect: Mood normal.        Behavior: Behavior normal.     MDM  HIGH  HA in pregnancy with gHTN Postpartum NSVD  06/17/24  Prenatal chart reviewed including inpatient records from labor and delivery Physical exam performed BP monitoring: MRBP's CMP: NM CBC: NM Tylenol , Reglan , Flexeril  for headache  Discussed with Dr Zina [facility] Attending) Will plan to increase her Labetalol  to 300 mg TID and OB f/u within 1-2 days. Rx's for Flexeril  and Reglan  for HA's also sent to pharmacy for patient with precautions.   Orders Placed This Encounter  Procedures   CBC    Standing Status:   Standing    Number of Occurrences:   1   Comprehensive metabolic panel    Standing Status:   Standing    Number of Occurrences:   1   Measure blood pressure    Standing Status:   Standing    Number of Occurrences:   1   Discharge patient Discharge disposition: 01-Home or Self Care; Discharge patient date: 06/30/2024    Standing Status:   Standing    Number of Occurrences:   1    Discharge disposition:   01-Home or Self Care [1]    Discharge patient date:   06/30/2024      Results for orders placed or performed during  the hospital encounter of 06/30/24 (from the past 24 hours)  CBC     Status: None   Collection Time: 06/30/24  1:48 PM  Result Value Ref Range   WBC 6.3 4.0 - 10.5 K/uL   RBC 4.73 3.87 - 5.11 MIL/uL   Hemoglobin 13.8 12.0 - 15.0 g/dL   HCT 57.5 63.9 - 53.9 %   MCV 89.6 80.0 - 100.0 fL   MCH 29.2 26.0 - 34.0 pg   MCHC 32.5 30.0 - 36.0 g/dL   RDW 87.2 88.4 - 84.4 %   Platelets 274 150 - 400 K/uL   nRBC 0.0 0.0 - 0.2 %  Comprehensive metabolic panel     Status: None   Collection Time: 06/30/24  1:48 PM  Result Value Ref Range   Sodium 140 135 - 145 mmol/L   Potassium 4.0 3.5 - 5.1 mmol/L   Chloride 104 98 - 111 mmol/L   CO2 22 22 - 32 mmol/L   Glucose, Bld 83 70 - 99 mg/dL   BUN 7 6 - 20 mg/dL   Creatinine, Ser 9.48 0.44 - 1.00 mg/dL   Calcium  9.1 8.9 - 10.3 mg/dL   Total Protein 6.8 6.5 - 8.1 g/dL   Albumin 3.5 3.5 - 5.0 g/dL   AST 20 15 - 41 U/L   ALT 23 0 - 44 U/L   Alkaline Phosphatase 114  38 - 126 U/L   Total Bilirubin 0.7 0.0 - 1.2 mg/dL   GFR, Estimated >39 >39 mL/min   Anion gap 14 5 - 15       I have reviewed the patient chart and performed the physical exam . I have ordered & interpreted the lab results and reviewed them with the patient Medications ordered as stated above/below.  A/P as described below.  Counseling and education provided and patient agreeable  with plan as described below. Verbalized understanding.    ASSESSMENT Medical screening exam complete   1. Gestational hypertension, antepartum (Primary)  2. Nonintractable headache, unspecified chronicity pattern, unspecified headache type  3. Postpartum state     PLAN  1-2 days for BP check  Discharge from MAU in stable condition  See AVS for full description of educational information and instructions provided to the patient at time of discharge  Warning signs for worsening condition that would warrant emergency follow-up discussed Patient may return to MAU as needed   Mcdaniel Nicole LABOR, NP 06/30/2024 3:21 PM   This chart was dictated using voice recognition software, Dragon. Despite the best efforts of this provider to proofread and correct errors, errors may still occur which can change documentation meaning.

## 2024-09-05 DIAGNOSIS — N760 Acute vaginitis: Secondary | ICD-10-CM | POA: Diagnosis not present
